# Patient Record
Sex: Male | Born: 1955 | Race: White | Hispanic: No | Marital: Married | Smoking: Never smoker
Health system: Southern US, Community
[De-identification: ages and names within clinical notes are randomized; demographics above are authoritative.]

## PROBLEM LIST (undated history)

## (undated) DIAGNOSIS — K5790 Diverticulosis of intestine, part unspecified, without perforation or abscess without bleeding: Secondary | ICD-10-CM

## (undated) DIAGNOSIS — M109 Gout, unspecified: Secondary | ICD-10-CM

## (undated) DIAGNOSIS — C801 Malignant (primary) neoplasm, unspecified: Secondary | ICD-10-CM

## (undated) DIAGNOSIS — T7840XA Allergy, unspecified, initial encounter: Secondary | ICD-10-CM

## (undated) DIAGNOSIS — M1990 Unspecified osteoarthritis, unspecified site: Secondary | ICD-10-CM

## (undated) DIAGNOSIS — K259 Gastric ulcer, unspecified as acute or chronic, without hemorrhage or perforation: Secondary | ICD-10-CM

## (undated) DIAGNOSIS — G709 Myoneural disorder, unspecified: Secondary | ICD-10-CM

## (undated) DIAGNOSIS — Z860100 Personal history of colon polyps, unspecified: Secondary | ICD-10-CM

## (undated) DIAGNOSIS — N200 Calculus of kidney: Secondary | ICD-10-CM

## (undated) HISTORY — DX: Diverticulosis of intestine, part unspecified, without perforation or abscess without bleeding: K57.90

## (undated) HISTORY — PX: MENISCUS REPAIR: SHX5179

## (undated) HISTORY — DX: Unspecified osteoarthritis, unspecified site: M19.90

## (undated) HISTORY — PX: NASAL SEPTUM SURGERY: SHX37

## (undated) HISTORY — DX: Myoneural disorder, unspecified: G70.9

## (undated) HISTORY — DX: Allergy, unspecified, initial encounter: T78.40XA

## (undated) HISTORY — PX: OTHER SURGICAL HISTORY: SHX169

## (undated) HISTORY — DX: Malignant (primary) neoplasm, unspecified: C80.1

## (undated) HISTORY — DX: Personal history of colonic polyps: Z86.010

## (undated) HISTORY — PX: EYE SURGERY: SHX253

## (undated) HISTORY — PX: UVULOPLASTY: SHX6557

## (undated) HISTORY — DX: Gilbert syndrome: E80.4

## (undated) HISTORY — DX: Personal history of colon polyps, unspecified: Z86.0100

## (undated) HISTORY — DX: Gastric ulcer, unspecified as acute or chronic, without hemorrhage or perforation: K25.9

## (undated) HISTORY — PX: VASECTOMY: SHX75

---

## 2001-10-15 ENCOUNTER — Ambulatory Visit (HOSPITAL_BASED_OUTPATIENT_CLINIC_OR_DEPARTMENT_OTHER): Admission: RE | Admit: 2001-10-15 | Discharge: 2001-10-15 | Payer: Self-pay | Admitting: Otolaryngology

## 2001-12-18 ENCOUNTER — Encounter: Payer: Self-pay | Admitting: Otolaryngology

## 2001-12-21 ENCOUNTER — Encounter (INDEPENDENT_AMBULATORY_CARE_PROVIDER_SITE_OTHER): Payer: Self-pay | Admitting: *Deleted

## 2001-12-21 ENCOUNTER — Inpatient Hospital Stay (HOSPITAL_COMMUNITY): Admission: RE | Admit: 2001-12-21 | Discharge: 2001-12-23 | Payer: Self-pay | Admitting: Otolaryngology

## 2003-05-20 ENCOUNTER — Encounter: Payer: Self-pay | Admitting: Internal Medicine

## 2003-05-20 ENCOUNTER — Ambulatory Visit (HOSPITAL_COMMUNITY): Admission: RE | Admit: 2003-05-20 | Discharge: 2003-05-20 | Payer: Self-pay | Admitting: Internal Medicine

## 2003-06-02 HISTORY — PX: ESOPHAGOGASTRODUODENOSCOPY: SHX1529

## 2003-08-19 ENCOUNTER — Ambulatory Visit: Admission: RE | Admit: 2003-08-19 | Discharge: 2003-09-15 | Payer: Self-pay | Admitting: Radiation Oncology

## 2003-11-07 ENCOUNTER — Ambulatory Visit (HOSPITAL_COMMUNITY): Admission: RE | Admit: 2003-11-07 | Discharge: 2003-11-07 | Payer: Self-pay | Admitting: Urology

## 2003-11-12 ENCOUNTER — Encounter (INDEPENDENT_AMBULATORY_CARE_PROVIDER_SITE_OTHER): Payer: Self-pay | Admitting: Specialist

## 2003-11-12 ENCOUNTER — Inpatient Hospital Stay (HOSPITAL_COMMUNITY): Admission: RE | Admit: 2003-11-12 | Discharge: 2003-11-14 | Payer: Self-pay | Admitting: Urology

## 2005-02-11 ENCOUNTER — Encounter: Admission: RE | Admit: 2005-02-11 | Discharge: 2005-02-11 | Payer: Self-pay | Admitting: Orthopedic Surgery

## 2005-05-04 ENCOUNTER — Ambulatory Visit (HOSPITAL_COMMUNITY): Admission: RE | Admit: 2005-05-04 | Discharge: 2005-05-04 | Payer: Self-pay | Admitting: Internal Medicine

## 2005-08-29 ENCOUNTER — Encounter: Admission: RE | Admit: 2005-08-29 | Discharge: 2005-08-29 | Payer: Self-pay | Admitting: Internal Medicine

## 2005-10-24 ENCOUNTER — Encounter: Admission: RE | Admit: 2005-10-24 | Discharge: 2005-10-24 | Payer: Self-pay | Admitting: Orthopedic Surgery

## 2006-12-15 ENCOUNTER — Encounter: Admission: RE | Admit: 2006-12-15 | Discharge: 2006-12-15 | Payer: Self-pay | Admitting: Orthopedic Surgery

## 2008-11-26 ENCOUNTER — Emergency Department (HOSPITAL_BASED_OUTPATIENT_CLINIC_OR_DEPARTMENT_OTHER): Admission: EM | Admit: 2008-11-26 | Discharge: 2008-11-26 | Payer: Self-pay | Admitting: Emergency Medicine

## 2008-11-26 ENCOUNTER — Ambulatory Visit: Payer: Self-pay | Admitting: Diagnostic Radiology

## 2010-10-10 ENCOUNTER — Encounter: Payer: Self-pay | Admitting: Internal Medicine

## 2010-11-05 ENCOUNTER — Encounter (HOSPITAL_COMMUNITY): Payer: 59

## 2010-11-05 ENCOUNTER — Other Ambulatory Visit: Payer: Self-pay | Admitting: Surgery

## 2010-11-05 DIAGNOSIS — Z01812 Encounter for preprocedural laboratory examination: Secondary | ICD-10-CM | POA: Insufficient documentation

## 2010-11-05 LAB — CBC
MCH: 29.7 pg (ref 26.0–34.0)
MCHC: 33.7 g/dL (ref 30.0–36.0)
Platelets: 213 10*3/uL (ref 150–400)
RBC: 5.55 MIL/uL (ref 4.22–5.81)

## 2010-11-05 LAB — SURGICAL PCR SCREEN: Staphylococcus aureus: NEGATIVE

## 2010-11-12 ENCOUNTER — Ambulatory Visit (HOSPITAL_COMMUNITY)
Admission: RE | Admit: 2010-11-12 | Discharge: 2010-11-12 | Disposition: A | Discharge status: Home / Self Care | Payer: 59 | Source: Ambulatory Visit | Attending: Surgery | Admitting: Surgery

## 2010-11-12 DIAGNOSIS — K420 Umbilical hernia with obstruction, without gangrene: Secondary | ICD-10-CM | POA: Insufficient documentation

## 2010-11-12 DIAGNOSIS — E669 Obesity, unspecified: Secondary | ICD-10-CM | POA: Insufficient documentation

## 2010-11-29 NOTE — Op Note (Signed)
NAME:  Terry Tate, Terry Tate NO.:  [id number]  MEDICAL RECORD NO.:  [id number]           PATIENT TYPE:  O  LOCATION:  DAYL                         FACILITY:  [facility]  PHYSICIAN:  Ardeth Sportsman, MD     DATE OF BIRTH:  May 17, 1956  DATE OF PROCEDURE:  11/12/2010 DATE OF DISCHARGE:                              OPERATIVE REPORT   PRIMARY CARE PHYSICIAN:  Thora Lance, M.D.  REQUESTING PHYSICIAN:  Bertram Millard. Dahlstedt, M.D., with [facility].  OPERATING SURGEON:  Ardeth Sportsman, M.D.  ASSISTANT:  RN.  PREOPERATIVE DIAGNOSIS:  Umbilical mass, probable incarcerated umbilical hernia.  POSTOPERATIVE DIAGNOSIS:   1.  Incarcerated umbilical hernia. 2.  Possible small right inguinal hernia, nonincarcerated.  PROCEDURE PERFORMED:  Laparoscopic reduction and repair of incarcerated umbilical hernia with mesh.  ANESTHESIA: 1. General anesthesia. 2. Local anesthetic in a field block around all fascial stitch sites.  SPECIMENS:  Incarcerated epiploic appendage (not sent).  DRAINS:  None.  ESTIMATED BLOOD LOSS:  Minimal.  COMPLICATIONS:  None.  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  It was sensitive to touch.  It was not reducible.  Anatomy and physiology of abdominal formation was discussed. Pathophysiology of subcutaneous masses and discussion of lipomas versus incarcerated umbilical hernia was discussed.  Options discussed. Recommendation was made for diagnostic laparoscopy with possible umbilical ventral hernia repair versus excision of lipoma.  Risks, benefits, and alternatives discussed.  Questions answered and he agreed to proceed.  DESCRIPTION OF PROCEDURE:  Informed consent was confirmed.  The patient received IV cefazolin prior to incision.  He voided prior to coming to the operating room.  He had sequential compression devices active during the  entire case.  He underwent general anesthesia without any difficulty.  His abdomen was clipped, prepped, and draped in a sterile fashion.  Surgical time-out confirmed the plan.  I placed a 5 mm port in the left upper quadrant using optical entry technique with the patient in steep reverse Trendelenburg and left side up.  Entry was clean.  I induced carbon dioxide insufflation.  Camera inspection revealed an extended epiploic appendage coming from the transverse colon and incarcerated in the umbilical region.  I was able to partially reduce it down after he was under complete general anesthesia.  I used cautery scissors and blunt dissection to reduce out this stretched down and dilated epiploic appendage and reduced out the fat.  It was rather long and stretched out with a thickened tip where it had been incarcerated.  I went ahead and tripped this excess epiploic appendage off to avoid a leak point of internal hernia.  I removed it out of the port.  Because it was obvious fat, I did not sent it.  I measured out the defect around 2 x 2 cm.  Given the fact he is a large male, very physically active, and it is 2 cm in size.  I felt he required mesh repair.   Therefore, I chose a 12 x 12 cm Parietex/Seprafilm dual-sided mesh.  I  rolled it  in the abdomen and secured it to the anterior abdominal pole using #1 Prolene  interrupted stitches x5 along its periphery.  There was some bleeding on the  left rectus muscles.  After placing the left lateral stitch, I placed a #1  Prolene above and below that and secured it down and that provided good hemostasis.  Of note, I had used a laparoscopic tacker to trim the rim and the central areas to hold in place.  I did camera inspection.  Hemostasis was excellent.  Colon looked healthy and uninjured.  I evacuated carbon dioxide.  I removed the ports.  I closed down the 10 mm fascial defect in left flank with 0 Vicryl stitch.  Note, I had placed this  stitch laparoscopically using a laparoscopic suture passer transabdominally under laparoscopic observation before closing down.  I closed the skin using 4-0 Monocryl stitch.  The puncture sites for the transabdominal fascial stitches were used using Steri-Strips.  A sterile dressing was applied.  The patient was extubated and taken to recovery room in stable condition.  I discussed postoperative care in detail with the patient in the office when I first met him and again in the holding area.  I have written instructions as well.  I am about to discuss it with his wife.     Ardeth Sportsman, MD     SCG/MEDQ  D:  11/12/2010  T:  11/13/2010  Job:  [id number]  cc:   Thora Lance, M.D. Fax: [phone]  Bertram Millard. Dahlstedt, M.D. Fax: [phone]  Electronically Signed by Karie Soda MD on 11/29/2010 12:21:28 PM

## 2010-12-30 LAB — URINALYSIS, ROUTINE W REFLEX MICROSCOPIC
Bilirubin Urine: NEGATIVE
Hgb urine dipstick: NEGATIVE
Ketones, ur: NEGATIVE mg/dL
Protein, ur: NEGATIVE mg/dL
Urobilinogen, UA: 0.2 mg/dL (ref 0.0–1.0)

## 2010-12-30 LAB — BASIC METABOLIC PANEL
BUN: 15 mg/dL (ref 6–23)
CO2: 28 mEq/L (ref 19–32)
Calcium: 9.3 mg/dL (ref 8.4–10.5)
Creatinine, Ser: 1 mg/dL (ref 0.4–1.5)
Glucose, Bld: 78 mg/dL (ref 70–99)

## 2010-12-30 LAB — DIFFERENTIAL
Basophils Absolute: 0.1 10*3/uL (ref 0.0–0.1)
Basophils Relative: 1 % (ref 0–1)
Neutro Abs: 7.4 10*3/uL (ref 1.7–7.7)
Neutrophils Relative %: 64 % (ref 43–77)

## 2010-12-30 LAB — CBC
MCHC: 34.4 g/dL (ref 30.0–36.0)
RDW: 13.3 % (ref 11.5–15.5)

## 2011-02-04 NOTE — Discharge Summary (Signed)
NAME:  Terry Tate, Terry Tate NO.:  [id number]   MEDICAL RECORD NO.:  [id number]                   PATIENT TYPE:  INP   LOCATION:  1610                                 FACILITY:  [facility]   PHYSICIAN:  Jamison Neighbor, M.D.               DATE OF BIRTH:  [birth year]   DATE OF ADMISSION:  11/12/2003  DATE OF DISCHARGE:  11/14/2003                                 DISCHARGE SUMMARY   DISCHARGE DIAGNOSIS:  Carcinoma of the prostate.   SECONDARY DIAGNOSIS:  Osteoarthritis.   PRINCIPAL PROCEDURE:  Radical prostatectomy with pelvic lymph node  dissection.   HISTORY:  This 55 year old male underwent a biopsy because of a PSA of 4.1.  The patient was found to have Gleason score 6 adenocarcinoma bilaterally.  The patient was given the options of therapy, including watchful waiting,  seed implantation, radiation therapy, cryosurgery, chemotherapy, hormonal  therapy, watchful waiting, and radical prostatectomy.  We also talked about  laparoscopic radical prostatectomy as an option.  The patient elected to  undergone standard radical prostatectomy, and was admitted following that  procedure.   PAST MEDICAL HISTORY:  1. Anterior cervical diskectomy.  2. Uvulectomy because of chronic sleep apnea.  3. Septum repair.   MEDICATIONS:  The patient is on no prescription drugs at the time of  admission, but did take a number of over-the-counter supplements.   The patient's family history, social history, review of systems, are well  described in the initial history and physical.   HOSPITAL COURSE:  The patient was taken to the operating room where he  underwent radical retropubic prostatectomy.  In the early postoperative  period his blood pressure was down a little bit and we thought he might have  some postural hypertension.  We did watch the patient carefully and elected  not to give him any blood transfusions as he was relatively asymptomatic.  He was rapidly advanced to a  regular diet.  He tolerated pain medication  without any difficulty.  It was felt he was ready for discharge by  postoperative day #2.  The patient was sent home with his Foley catheter and  with instructions for management.  He was sent home with Tylox, Colace, and  Cipro which can be started in preparation for removal of his catheter.  We  note that the patient's postoperative hematocrit was 27, it was not felt  that was low enough to require transfusion, but once he is stabilized at  home he will be started on iron supplementation.  He will return to the  office in one week for staple removal, and in two weeks for Foley removal,  as well as for review of his pathology.  Jamison Neighbor, M.D.    RJE/MEDQ  D:  11/27/2003  T:  11/28/2003  Job:  [id number]

## 2011-02-04 NOTE — H&P (Signed)
NAME:  Terry Tate, Terry Tate NO.:  [id number]   MEDICAL RECORD NO.:  [id number]                   PATIENT TYPE:  INP   LOCATION:  X001                                 FACILITY:  [facility]   PHYSICIAN:  Jamison Neighbor, M.D.               DATE OF BIRTH:  1956-08-07   DATE OF ADMISSION:  11/12/2003  DATE OF DISCHARGE:                                HISTORY & PHYSICAL   ADMITTING DIAGNOSIS:  Carcinoma of the prostate.   HISTORY:  This 55 year old male underwent prostate biopsy because his PSA  was 4.1, and, at his age, his PSA should be only 2.5.  He has had a  Gleason's score 6 adenocarcinoma bilaterally.  The patient was given options  as to all the appropriate therapy, and he elected to undergo surgery as  opposed to seed implantation or accompanying radiation therapy.  The patient  understands the risks and benefits of the procedure, and is to be admitted  following his radical prostatectomy.   PAST MEDICAL HISTORY:  Unremarkable.  He is status post anterior cervical  diskectomy.  He has had septum repairs, and he also underwent a uvulectomy  because of some problems with chronic sleep apnea.  He has no other chronic  medical illnesses.   CURRENT MEDICATIONS:  1. Multivitamin.  2. Glucosamine.  3. Chondroitin.  4. Advil.  5. Melatonin.  (All taken over-the-counter.)   REVIEW OF SYSTEMS:  Pertinent for his painful left ankle, probably due to  arthritis.  He does have some problems with poor sleep, although his sleep  apnea is gone since his uvulectomy.  He denies any other cardiac, pulmonary,  musculoskeletal, neurologic, endocrine, vascular, gastrointestinal,  dermatologic, or psychologic source, other than as described above.   PHYSICAL EXAMINATION:  VITAL SIGNS:  Temperature 97.3, pulse 68,  respirations 20, blood pressure 132/108, weight 252.  HEENT:  Normocephalic and atraumatic.  Cranial nerves II-XII were grossly  intact.  NECK:  Supple.  No  adenopathy or thyromegaly.  LUNGS:  Clear.  HEART:  Regular rate and rhythm without murmurs, thrills, gallops, rubs, or  heaves.  ABDOMEN:  Soft, nontender, with no palpable masses, rebound, or guarding.  GU:  Unremarkable.  The penis is free of any lesions.  The testicles are  normal in size, shape, and consistency.  There is no hydrocele, __________,  varicocele, hernia, or adenopathy.  RECTAL:  Shows microscopic prostate cancer, non-palpable to exam.  On  examination it feels quite benign.  The sphincter tone is normal.  EXTREMITIES:  No clubbing, cyanosis, or edema.  NEUROLOGIC/VASCULAR SYSTEMS:  Intact.   IMPRESSION:  Biopsy-proven carcinoma of the prostate, Gleason's score 6  bilateral.   PLAN:  Admit following radical retropubic prostatectomy.  Jamison Neighbor, M.D.    RJE/MEDQ  D:  11/12/2003  T:  11/12/2003  Job:  [id number]

## 2011-02-04 NOTE — Op Note (Signed)
[facility]  Patient:    Terry, Tate Visit Number: [id number] MRN: [id number]          Service Type: SUR Location: [id number] Attending Physician:  Fernande Boyden Dictated by:   Gloris Manchester. Lazarus Salines, M.D. Proc. Date: 12/21/01 Admit Date:  12/21/2001   CC:         [facility]   Operative Report  PREOPERATIVE DIAGNOSES: 1. Obstructive sleep apnea. 2. Nasal septal deviation with obstruction. 3. Hypertrophic inferior turbinates with obstruction.  POSTOPERATIVE DIAGNOSES: 1. Obstructive sleep apnea. 2. Nasal septal deviation with obstruction. 3. Hypertrophic inferior turbinates with obstruction.  PROCEDURES PERFORMED: 1. Nasal septoplasty. 2. UPP. 3. SMR of inferior turbinates, bilateral. 4. Tonsillectomy, bilateral.  SURGEON:  Karol T. Lazarus Salines, M.D.  ANESTHESIA:  General orotracheal.  BLOOD LOSS:  Minimal.  COMPLICATIONS:  None.  FINDINGS: An overall corrugated nasal septum, deviated towards the right. Hypertrophic inferior turbinates bilateral, right more than left.  2+ imbedded tonsils.  Long, thick uvula. Bulky tongue.  PROCEDURE:  With the patient in a comfortable supine position, general orotracheal anesthesia was induced without difficulty.  At an appropriate level, the table was turned 90 degrees and the patient placed in a semisitting position.  A saline-moistened throat pack was placed.  Nasal vibrissa were trimmed on both sides.  Cocaine crystals, 200 mg total, were applied on cotton carriers to the anterior ethmoid and sphenopalatine ganglion regions on both sides.  Cocaine solution, 150 mg, was applied on 0.5 x 3 inch cottonoids to both sides of the mucosa.  Finally, 1% Xylocaine with 1:100,000 epinephrine, 12 cc total, was infiltrated into the anterior floor of the nose, into the membranous columella, into the nasal spine region, and finally into the submucoperichondrial plane of the septum on both  sides.  Several minutes were allowed for this to take effect.  The materials were removed from the nose and observed to be intact and correct in number.  The findings were as described above.  A small right floor incision was sharply executed, and floor tunnel was elevated and carried up to the vomer posteriorly and brought forward.  On the left side, a hemitransfixion incision was sharply executed and continued down into a floor incision.  A floor tunnel was elevated back to the vomer and brought forward. The submucoperichondrial plane of the left septum was elevated, carried back to the perpendicular plate, and brought down and communicated with a floor tunnel.  A small linear rent anteriorly was generated at the maxillary crest with spurring.  Otherwise, the flap was intact.  The chondroethmoid junction was identified and lysed with a caudal elevator, and the opposite submucoperiosteal plane of the septum was elevated.  The superior perpendicular plate was lysed with open Jansen-Middleton forceps to avoid rocking in the cribriform plate region. Working posteriorly and inferiorly, the septum was submucosally dissected and then rocked free with the closed Jansen-Middleton forceps.  Additional spicules back toward the rostrum of the sphenoid and down towards the vomer were removed where they were mobile or displaced.  The posterior-inferior corner of the quadrangular cartilage was submucosally resected where it was spurred towards the right.  The septum was disarticulated from the maxillary crest, and 1-2 mm of inferior edge where it was heavily spurred were submucosally resected.  There was a spur in the sagittal plane vertically towards the left, and a 1 mm wide strip of cartilage was resected from this area to allow it to hinge.  The incision was carried up through the dorsal aspect of the septum to allow full hinge effect.  The cut edges of the cartilage were controlled with  interrupted 4-0 chromic suture to prevent imbrication.  At this point, the septum was straight and in the midline.  It was secured to the nasal spine with a figure-of-eight 4-0 PDS suture.  Septal tunnels were suctioned free, and hemostasis was observed.  A good, straight configuration of the septum was noted.  The incisions were closed with interrupted 4-0 chromic suture.  Just prior to completing the septoplasty, the inferior turbinates were infiltrated with 1% Xylocaine with 1:100,000 epinephrine, 5 cc total. Following completion of the septoplasty, beginning on the right side, the anterior hood of the inferior turbinate was lysed just behind the nasal valve region.  The medial mucosa of the turbinate was incised and brought forward in an anterior upsloping fashion, and a laterally based flap was elevated.  The turbinate was in-fractured.  Using angled turbinate scissors, turbinate bone and lateral mucosa were resected in a posterior, downsloping fashion, taking virtually all of the anterior pole and leaving virtually all of the posterior pole.  Cut spicules of bone were carefully submucosally resected to prevent delayed healing.  The bulbous posterior pole and mucosal edges were suctioned and coagulated for hemostasis.  The flap was lain back down.  The turbinate was outfractured, and this side was completed.  The left side was done in identical fashion, except that the turbinate was overall smaller.  After completing the septum and both turbinates, 0.04 reinforced Silastic was fashioned and placed against the septum and secured there, too, with a 3-0 nylon stitch.  A double-thickness Neosporin-impregnated Telfa pack was applied along the inferior turbinate on both sides.  Finally, 6.5 mm nasal trumpets, shortened to reach into the nasopharynx were placed on each side.  Again, hemostasis was observed.  The nasal procedure was completed.  The patient at this point was placed in  Trendelenburg.  The pharynx was  suctioned free, and the throat pack was removed.  A #4 flat blade Crowe-Davis mouth gag was used, placed without difficulty, taken care to protect lips, teeth, and endotracheal tube.  This allowed adequate visualization of the pharynx, although the tongue tended to ride towards one side or the other. 0.5% Xylocaine with 1:200,000 epinephrine, 8 cc total, was infiltrated into the peritonsillar planes and along the proposed incision site of the soft palate.  The previously placed tattoo mark on the muscular dimple of the soft palate was identified.  Using coagulating cautery, an arch was planned across the soft palate, right at the level of the [place] ink tattoo and carried down onto the anterior pillar of the tonsils.  This incision was carried into the submucosa and muscularis of the soft palate, cutting across the uvular musculature. The same incision was used to dissect it down onto the capsule of the tonsils, which were resected in their entirety, along with the anterior tonsillar pillar.  The pillar and tonsils were brought upward on both sides and communicated with the palatal flap.  The palate was finally amputated, leaving a longer posterior flap to reapproximate forward.  The palate with both tonsils en bloc was passed off as specimen.  Hemostasis was observed.  Beginning in the midline, the posterior mucosa was brought forward and reapproximated at the anterior palatal mucosa using interrupted 4-0 Vicryl sutures.  This was carried down to each side.  There was a slight horizontal banding in the posterior  pharyngeal wall, indicating adequate tension.  No further resection was felt necessary.  The palatal incision was closed in its entirety with a small area remaining open at the base of the tonsil on each side.  Hemostasis was observed.  The palate retractor was relaxed for several minutes.  Upon re-expansion, hemostasis was persistent.  At this  point, the procedure was completed.  An 81 French orogastric tube was placed into the stomach, and a small amount of clear, bilious secretions was evacuated.  The tube was removed.  The patient was returned to anesthesia, awakened, extubated, and transferred to recovery in stable condition.  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  Documented sleep apnea with a respiratory disturbance index of 23 was the indication for several components of todays procedure.  Anticipate a routine postoperative recovery with attention to analgesia, antibiosis, hydration, and observation for bleeding, emesis, or airway compromise.  Will plan on removing the nasal packing and tubes in 2-3 days and the septal splints at a 10-day mark.  Will have him maintain a limited activity for 2-1/2--3 weeks. Dictated by:   Gloris Manchester. Lazarus Salines, M.D. Attending Physician:  Fernande Boyden DD:  12/21/01 TD:  12/23/01 Job: [id number]

## 2011-02-04 NOTE — Op Note (Signed)
NAME:  Terry Tate, Terry Tate NO.:  [id number]   MEDICAL RECORD NO.:  [id number]                   PATIENT TYPE:  INP   LOCATION:  X001                                 FACILITY:  [facility]   PHYSICIAN:  Jamison Neighbor, M.D.               DATE OF BIRTH:  06-Aug-1956   DATE OF PROCEDURE:  11/12/2003  DATE OF DISCHARGE:                                 OPERATIVE REPORT   PREOPERATIVE DIAGNOSIS:  Carcinoma of the prostate.   POSTOPERATIVE DIAGNOSIS:  Carcinoma of the prostate.   OPERATION/PROCEDURE:  Radical retropubic prostatectomy.   SURGEON:  Jamison Neighbor, M.D.   ASSISTANT:  Valetta Fuller, M.D.   ANESTHESIA:  General.   COMPLICATIONS:  None.   DRAINS:  20-French Silastic Foley catheter and #10 flat Jackson-Pratt drain.   HISTORY:  This is a 55 year old male who had a biopsy of the prostate done  because his PSA was 4.1.  At his age normal should be 2.5.  The patient was  found to have 10% of his prostatic biopsy on each side positive for  carcinoma with a Gleason's score of 6.  The patient had a chance to review  all the options for therapy including radical retropubic prostatectomy,  radical perineal prostatectomy, robotic laparoscopic prostatectomy,  radiation therapy, seed implantation, cryosurgery, chemotherapy, and  hormonal therapy.  The patient has elected to undergo radical prostatectomy.  He understands the risks and benefits of the procedure.  He is aware of the  potential for problems with erections as well as for urinary control as well  as the options available to him should he require additional therapy.  He  gave full informed consent.   DESCRIPTION OF PROCEDURE:  After successful induction of general anesthesia,  the patient was placed in the supine position, prepped with Betadine and  draped in the usual sterile fashion.   A lower midline incision was made and was carried down through Scarpa's  fascia and down to the rectus sheath.   The rectus sheath was opened in the  midline.  The rectus abdominis was split and the space of Retzius was  developed.  Buchwalter retractor was inserted.  A node dissection was  performed on each side.  The node packets were taken from the obturator  fossa exposing the obturator nerve but not injuring them in any way.  On  both sides, hemostasis was obtained with surgical clips.  The endopelvic  fascia was opened on each side and the puboprostatics were identified.  The  puboprostatics were cleaned off and then clipped.  A Hohenfellner clamp was  placed around the dorsal vein.  The dorsal vein was ligated with two  separate ties of 0 Vicryl and a suture ligature of 2-0 Vicryl was also  utilized.  The dorsal vein was not difficult to control but lateral bleeding  on the patient's right-hand side and a large vascular pedicle  was somewhat  difficult to control but eventually this was oversewn and controlled with  Vicryl sutures.  Care was taken to insure that this did not in any way  impact all the neurovascular bundles.  The urethra was identified and after  the dorsal vein had been transected, the urethra could easily be palpated.  The neurovascular bundle was not injured.  The urethra was encircled and the  top portion of the urethra was cut.  The Foley catheter was brought up  through the incision.  It was then cut and then the back wall of the Foley  catheter was cut with an excellent stump of urethra left in place.   The prostate was then elevated and an appropriate plane was developed.  The  lateral pedicles were taken down with good nerve-sparing observed.  Seminal  vesicles were identified after Denonvilliers fascia had been opened.  The  vas were clipped on each side.  Seminal vessels were dissected down and  clipped at the base to control the vessels.  The remainder of the pedicle  was taken down with surgical clips.  The bladder neck was then transected  with much of the  circular fibers preserved.  A small specimen was sent from  the bladder neck to insure that there was no residual prostatic tissue left.  The mucosa was everted with 4-0 Vicryl sutures.  The anastomosis was then  performed in the usual fashion.  Double-armed sutures of Monocryl were then  placed at the 2 o'clock, 5 o'clock, 7 o'clock, 10 o'clock, and 12 o'clock  positions and were appropriately tagged.  The Foley catheter was then  brought up into the bladder and all the sutures were then placed at the  appropriate location on the bladder neck.  The catheter was inflated.  The  anastomosis was then completed by tying down the sutures with an appropriate  degree of tension.  When the catheter was irrigated, there was no leakage.  Final specimens showed that adequate hemostasis had been obtained.  All  sponges and lap packs had been removed.  The sponge, instrument and needle  counts were correct prior to closure.  The patient had a small drain placed  and sutured in place with a 2-0 silk suture.   The incision was then closed with a running suture of #1 PDS.  The skin was  closed with surgical staples after irrigation.  The patient tolerated the  procedure well and was taken to the recovery room in good condition.                                               Jamison Neighbor, M.D.    RJE/MEDQ  D:  11/12/2003  T:  11/12/2003  Job:  [id number]   cc:   Thora Lance, M.D.  [address]  [place]  [place] [address]  Fax: [phone]

## 2011-06-07 ENCOUNTER — Other Ambulatory Visit: Payer: Self-pay | Admitting: Dermatology

## 2012-12-17 ENCOUNTER — Other Ambulatory Visit: Payer: Self-pay | Admitting: Dermatology

## 2017-02-16 DIAGNOSIS — D1801 Hemangioma of skin and subcutaneous tissue: Secondary | ICD-10-CM | POA: Diagnosis not present

## 2017-02-16 DIAGNOSIS — D225 Melanocytic nevi of trunk: Secondary | ICD-10-CM | POA: Diagnosis not present

## 2017-02-16 DIAGNOSIS — L570 Actinic keratosis: Secondary | ICD-10-CM | POA: Diagnosis not present

## 2017-02-16 DIAGNOSIS — L821 Other seborrheic keratosis: Secondary | ICD-10-CM | POA: Diagnosis not present

## 2017-03-12 DIAGNOSIS — T1502XA Foreign body in cornea, left eye, initial encounter: Secondary | ICD-10-CM | POA: Diagnosis not present

## 2017-05-09 DIAGNOSIS — Z86010 Personal history of colonic polyps: Secondary | ICD-10-CM | POA: Diagnosis not present

## 2017-05-09 DIAGNOSIS — Z1211 Encounter for screening for malignant neoplasm of colon: Secondary | ICD-10-CM | POA: Diagnosis not present

## 2017-06-16 DIAGNOSIS — Z09 Encounter for follow-up examination after completed treatment for conditions other than malignant neoplasm: Secondary | ICD-10-CM | POA: Diagnosis not present

## 2017-06-16 DIAGNOSIS — K5790 Diverticulosis of intestine, part unspecified, without perforation or abscess without bleeding: Secondary | ICD-10-CM | POA: Diagnosis not present

## 2017-06-16 DIAGNOSIS — Z1211 Encounter for screening for malignant neoplasm of colon: Secondary | ICD-10-CM | POA: Diagnosis not present

## 2017-06-16 DIAGNOSIS — Z8719 Personal history of other diseases of the digestive system: Secondary | ICD-10-CM | POA: Diagnosis not present

## 2017-06-16 DIAGNOSIS — K5730 Diverticulosis of large intestine without perforation or abscess without bleeding: Secondary | ICD-10-CM | POA: Diagnosis not present

## 2017-06-16 DIAGNOSIS — Z86010 Personal history of colonic polyps: Secondary | ICD-10-CM | POA: Diagnosis not present

## 2017-06-16 HISTORY — PX: COLONOSCOPY: SHX174

## 2017-06-25 DIAGNOSIS — L259 Unspecified contact dermatitis, unspecified cause: Secondary | ICD-10-CM | POA: Diagnosis not present

## 2017-06-25 DIAGNOSIS — Z23 Encounter for immunization: Secondary | ICD-10-CM | POA: Diagnosis not present

## 2017-07-21 DIAGNOSIS — N200 Calculus of kidney: Secondary | ICD-10-CM | POA: Diagnosis not present

## 2017-07-21 DIAGNOSIS — E291 Testicular hypofunction: Secondary | ICD-10-CM | POA: Diagnosis not present

## 2017-07-25 DIAGNOSIS — Z0000 Encounter for general adult medical examination without abnormal findings: Secondary | ICD-10-CM | POA: Diagnosis not present

## 2017-07-25 DIAGNOSIS — E7800 Pure hypercholesterolemia, unspecified: Secondary | ICD-10-CM | POA: Diagnosis not present

## 2017-07-25 DIAGNOSIS — M65341 Trigger finger, right ring finger: Secondary | ICD-10-CM | POA: Diagnosis not present

## 2017-07-25 DIAGNOSIS — G4700 Insomnia, unspecified: Secondary | ICD-10-CM | POA: Diagnosis not present

## 2017-12-06 DIAGNOSIS — M7662 Achilles tendinitis, left leg: Secondary | ICD-10-CM | POA: Diagnosis not present

## 2017-12-06 DIAGNOSIS — G5761 Lesion of plantar nerve, right lower limb: Secondary | ICD-10-CM | POA: Diagnosis not present

## 2017-12-06 DIAGNOSIS — M67372 Transient synovitis, left ankle and foot: Secondary | ICD-10-CM | POA: Diagnosis not present

## 2017-12-06 DIAGNOSIS — M7661 Achilles tendinitis, right leg: Secondary | ICD-10-CM | POA: Diagnosis not present

## 2017-12-06 DIAGNOSIS — M9262 Juvenile osteochondrosis of tarsus, left ankle: Secondary | ICD-10-CM | POA: Diagnosis not present

## 2017-12-06 DIAGNOSIS — M9261 Juvenile osteochondrosis of tarsus, right ankle: Secondary | ICD-10-CM | POA: Diagnosis not present

## 2017-12-06 DIAGNOSIS — M19072 Primary osteoarthritis, left ankle and foot: Secondary | ICD-10-CM | POA: Diagnosis not present

## 2017-12-20 DIAGNOSIS — G5762 Lesion of plantar nerve, left lower limb: Secondary | ICD-10-CM | POA: Diagnosis not present

## 2017-12-20 DIAGNOSIS — M7661 Achilles tendinitis, right leg: Secondary | ICD-10-CM | POA: Diagnosis not present

## 2017-12-20 DIAGNOSIS — G5761 Lesion of plantar nerve, right lower limb: Secondary | ICD-10-CM | POA: Diagnosis not present

## 2017-12-20 DIAGNOSIS — M7751 Other enthesopathy of right foot: Secondary | ICD-10-CM | POA: Diagnosis not present

## 2017-12-27 DIAGNOSIS — L820 Inflamed seborrheic keratosis: Secondary | ICD-10-CM | POA: Diagnosis not present

## 2017-12-27 DIAGNOSIS — D2361 Other benign neoplasm of skin of right upper limb, including shoulder: Secondary | ICD-10-CM | POA: Diagnosis not present

## 2017-12-27 DIAGNOSIS — L821 Other seborrheic keratosis: Secondary | ICD-10-CM | POA: Diagnosis not present

## 2017-12-27 DIAGNOSIS — D2239 Melanocytic nevi of other parts of face: Secondary | ICD-10-CM | POA: Diagnosis not present

## 2018-01-01 DIAGNOSIS — G5763 Lesion of plantar nerve, bilateral lower limbs: Secondary | ICD-10-CM | POA: Diagnosis not present

## 2018-01-01 DIAGNOSIS — M7751 Other enthesopathy of right foot: Secondary | ICD-10-CM | POA: Diagnosis not present

## 2018-05-31 DIAGNOSIS — G5763 Lesion of plantar nerve, bilateral lower limbs: Secondary | ICD-10-CM | POA: Diagnosis not present

## 2018-06-15 DIAGNOSIS — M25562 Pain in left knee: Secondary | ICD-10-CM | POA: Insufficient documentation

## 2018-06-22 DIAGNOSIS — M84374A Stress fracture, right foot, initial encounter for fracture: Secondary | ICD-10-CM | POA: Diagnosis not present

## 2018-06-26 DIAGNOSIS — M25562 Pain in left knee: Secondary | ICD-10-CM | POA: Diagnosis not present

## 2018-06-28 DIAGNOSIS — M84374D Stress fracture, right foot, subsequent encounter for fracture with routine healing: Secondary | ICD-10-CM | POA: Diagnosis not present

## 2018-07-12 DIAGNOSIS — M84374D Stress fracture, right foot, subsequent encounter for fracture with routine healing: Secondary | ICD-10-CM | POA: Diagnosis not present

## 2018-07-25 DIAGNOSIS — S83242A Other tear of medial meniscus, current injury, left knee, initial encounter: Secondary | ICD-10-CM | POA: Diagnosis not present

## 2018-07-25 DIAGNOSIS — M25562 Pain in left knee: Secondary | ICD-10-CM | POA: Diagnosis not present

## 2018-07-26 DIAGNOSIS — E291 Testicular hypofunction: Secondary | ICD-10-CM | POA: Diagnosis not present

## 2018-07-26 DIAGNOSIS — M84374D Stress fracture, right foot, subsequent encounter for fracture with routine healing: Secondary | ICD-10-CM | POA: Diagnosis not present

## 2018-07-26 DIAGNOSIS — Z8546 Personal history of malignant neoplasm of prostate: Secondary | ICD-10-CM | POA: Diagnosis not present

## 2018-07-26 DIAGNOSIS — E7800 Pure hypercholesterolemia, unspecified: Secondary | ICD-10-CM | POA: Diagnosis not present

## 2018-07-26 DIAGNOSIS — Z5181 Encounter for therapeutic drug level monitoring: Secondary | ICD-10-CM | POA: Diagnosis not present

## 2018-07-26 DIAGNOSIS — Z0000 Encounter for general adult medical examination without abnormal findings: Secondary | ICD-10-CM | POA: Diagnosis not present

## 2018-07-27 DIAGNOSIS — C61 Malignant neoplasm of prostate: Secondary | ICD-10-CM | POA: Diagnosis not present

## 2018-08-03 DIAGNOSIS — E291 Testicular hypofunction: Secondary | ICD-10-CM | POA: Diagnosis not present

## 2018-08-03 DIAGNOSIS — N200 Calculus of kidney: Secondary | ICD-10-CM | POA: Diagnosis not present

## 2018-08-03 DIAGNOSIS — C61 Malignant neoplasm of prostate: Secondary | ICD-10-CM | POA: Diagnosis not present

## 2018-08-07 DIAGNOSIS — E291 Testicular hypofunction: Secondary | ICD-10-CM | POA: Diagnosis not present

## 2018-08-14 DIAGNOSIS — G8918 Other acute postprocedural pain: Secondary | ICD-10-CM | POA: Diagnosis not present

## 2018-08-14 DIAGNOSIS — M23322 Other meniscus derangements, posterior horn of medial meniscus, left knee: Secondary | ICD-10-CM | POA: Diagnosis not present

## 2018-08-14 DIAGNOSIS — M948X6 Other specified disorders of cartilage, lower leg: Secondary | ICD-10-CM | POA: Diagnosis not present

## 2018-09-04 DIAGNOSIS — M25362 Other instability, left knee: Secondary | ICD-10-CM | POA: Diagnosis not present

## 2018-09-27 DIAGNOSIS — M25562 Pain in left knee: Secondary | ICD-10-CM | POA: Diagnosis not present

## 2018-10-29 DIAGNOSIS — E291 Testicular hypofunction: Secondary | ICD-10-CM | POA: Diagnosis not present

## 2018-11-07 ENCOUNTER — Emergency Department (HOSPITAL_BASED_OUTPATIENT_CLINIC_OR_DEPARTMENT_OTHER): Payer: 59

## 2018-11-07 ENCOUNTER — Other Ambulatory Visit: Payer: Self-pay

## 2018-11-07 ENCOUNTER — Encounter (HOSPITAL_BASED_OUTPATIENT_CLINIC_OR_DEPARTMENT_OTHER): Payer: Self-pay | Admitting: *Deleted

## 2018-11-07 ENCOUNTER — Emergency Department (HOSPITAL_BASED_OUTPATIENT_CLINIC_OR_DEPARTMENT_OTHER)
Admission: EM | Admit: 2018-11-07 | Discharge: 2018-11-07 | Disposition: A | Discharge status: Home / Self Care | Payer: 59 | Attending: Emergency Medicine | Admitting: Emergency Medicine

## 2018-11-07 DIAGNOSIS — K5792 Diverticulitis of intestine, part unspecified, without perforation or abscess without bleeding: Secondary | ICD-10-CM

## 2018-11-07 DIAGNOSIS — N200 Calculus of kidney: Secondary | ICD-10-CM | POA: Diagnosis not present

## 2018-11-07 DIAGNOSIS — R1084 Generalized abdominal pain: Secondary | ICD-10-CM | POA: Diagnosis present

## 2018-11-07 HISTORY — DX: Calculus of kidney: N20.0

## 2018-11-07 LAB — URINALYSIS, ROUTINE W REFLEX MICROSCOPIC
BILIRUBIN URINE: NEGATIVE
Glucose, UA: NEGATIVE mg/dL
HGB URINE DIPSTICK: NEGATIVE
KETONES UR: NEGATIVE mg/dL
Leukocytes,Ua: NEGATIVE
NITRITE: NEGATIVE
PH: 8 (ref 5.0–8.0)
Protein, ur: NEGATIVE mg/dL
SPECIFIC GRAVITY, URINE: 1.01 (ref 1.005–1.030)

## 2018-11-07 LAB — COMPREHENSIVE METABOLIC PANEL
ALBUMIN: 4.1 g/dL (ref 3.5–5.0)
ALK PHOS: 57 U/L (ref 38–126)
ALT: 25 U/L (ref 0–44)
ANION GAP: 6 (ref 5–15)
AST: 27 U/L (ref 15–41)
BILIRUBIN TOTAL: 1 mg/dL (ref 0.3–1.2)
BUN: 15 mg/dL (ref 8–23)
CALCIUM: 8.9 mg/dL (ref 8.9–10.3)
CO2: 26 mmol/L (ref 22–32)
Chloride: 104 mmol/L (ref 98–111)
Creatinine, Ser: 1.25 mg/dL — ABNORMAL HIGH (ref 0.61–1.24)
GFR calc Af Amer: >60 mL/min (ref >60)
GLUCOSE: 109 mg/dL — AB (ref 70–99)
POTASSIUM: 4.3 mmol/L (ref 3.5–5.1)
Sodium: 136 mmol/L (ref 135–145)
TOTAL PROTEIN: 7 g/dL (ref 6.5–8.1)

## 2018-11-07 LAB — CBC WITH DIFFERENTIAL/PLATELET
Abs Immature Granulocytes: 0.04 10*3/uL (ref 0.00–0.07)
Basophils Absolute: 0 10*3/uL (ref 0.0–0.1)
Basophils Relative: 0 %
EOS PCT: 2 %
Eosinophils Absolute: 0.2 10*3/uL (ref 0.0–0.5)
HEMATOCRIT: 51.1 % (ref 39.0–52.0)
Hemoglobin: 16.4 g/dL (ref 13.0–17.0)
Immature Granulocytes: 0 %
LYMPHS ABS: 3 10*3/uL (ref 0.7–4.0)
Lymphocytes Relative: 27 %
MCH: 28.5 pg (ref 26.0–34.0)
MCHC: 32.1 g/dL (ref 30.0–36.0)
MCV: 88.7 fL (ref 80.0–100.0)
MONO ABS: 1.2 10*3/uL — AB (ref 0.1–1.0)
MONOS PCT: 10 %
NRBC: 0 % (ref 0.0–0.2)
Neutro Abs: 6.8 10*3/uL (ref 1.7–7.7)
Neutrophils Relative %: 61 %
Platelets: 230 10*3/uL (ref 150–400)
RBC: 5.76 MIL/uL (ref 4.22–5.81)
RDW: 14.3 % (ref 11.5–15.5)
WBC: 11.2 10*3/uL — ABNORMAL HIGH (ref 4.0–10.5)

## 2018-11-07 LAB — LIPASE, BLOOD: LIPASE: 49 U/L (ref 11–51)

## 2018-11-07 MED ORDER — IOPAMIDOL (ISOVUE-300) INJECTION 61%
100.0000 mL | Freq: Once | INTRAVENOUS | Status: AC | PRN
Start: 2018-11-07 — End: 2018-11-07
  Administered 2018-11-07: 100 mL via INTRAVENOUS

## 2018-11-07 MED ORDER — METRONIDAZOLE 500 MG PO TABS
500.0000 mg | ORAL_TABLET | Freq: Once | ORAL | Status: AC
  Administered 2018-11-07: 500 mg via ORAL
  Filled 2018-11-07: qty 1

## 2018-11-07 MED ORDER — MORPHINE SULFATE (PF) 4 MG/ML IV SOLN
4.0000 mg | Freq: Once | INTRAVENOUS | Status: AC
  Administered 2018-11-07: 4 mg via INTRAVENOUS
  Filled 2018-11-07: qty 1

## 2018-11-07 MED ORDER — METRONIDAZOLE 500 MG PO TABS: 500.0000 mg | ORAL_TABLET | Freq: Three times a day (TID) | ORAL | 0 refills | Status: AC

## 2018-11-07 MED ORDER — CIPROFLOXACIN HCL 500 MG PO TABS
500.0000 mg | ORAL_TABLET | Freq: Once | ORAL | Status: AC
  Administered 2018-11-07: 500 mg via ORAL
  Filled 2018-11-07: qty 1

## 2018-11-07 MED ORDER — SODIUM CHLORIDE 0.9 % IV BOLUS
1000.0000 mL | Freq: Once | INTRAVENOUS | Status: AC
  Administered 2018-11-07: 1000 mL via INTRAVENOUS

## 2018-11-07 MED ORDER — CIPROFLOXACIN HCL 500 MG PO TABS: 500.0000 mg | ORAL_TABLET | Freq: Two times a day (BID) | ORAL | 0 refills | Status: DC

## 2018-11-07 NOTE — ED Triage Notes (Signed)
Pt c/o sudden onset of left flank pain x 2 days ago HX stones

## 2018-11-07 NOTE — ED Notes (Signed)
Pt drinking oral contrast.

## 2018-11-07 NOTE — ED Provider Notes (Signed)
[place] [facility] EMERGENCY DEPARTMENT Provider Note   CSN: [id number] Arrival date & time: 11/07/18  1443    History   Chief Complaint Chief Complaint  Patient presents with  . Flank Pain    HPI Terry Tate is a 63 y.o. male.     HPI Patient is a 63 year old male presents the emergency department 2 days of worsening left-sided abdominal pain and left flank pain.  No dysuria or urinary frequency.  He does have a history of kidney stones well as a history of diverticulitis.  He is not sure which one this may be.  He reports having some low back pain yesterday but this is now more localized to the left lower quadrant and left side today.  His pain is moderate in severity.  Denies nausea and vomiting.  He does not want any medication for pain at this time.  He reports constipation over the past 2 days.  No blood in his stool.  Low-grade fevers.   Past Medical History:  Diagnosis Date  . Kidney stones     There are no active problems to display for this patient.   Past Surgical History:  Procedure Laterality Date  . MENISCUS REPAIR          Home Medications    Prior to Admission medications   Medication Sig Start Date End Date Taking? Authorizing Provider  zolpidem (AMBIEN) 10 MG tablet Take 10 mg by mouth at bedtime as needed for sleep.   Yes [provider]  ciprofloxacin (CIPRO) 500 MG tablet Take 1 tablet (500 mg total) by mouth 2 (two) times daily. 11/07/18   Jola Schmidt, MD  metroNIDAZOLE (FLAGYL) 500 MG tablet Take 1 tablet (500 mg total) by mouth 3 (three) times daily for 7 days. 11/07/18 11/14/18  Jola Schmidt, MD    Family History History reviewed. No pertinent family history.  Social History Social History   Tobacco Use  . Smoking status: Never Smoker  . Smokeless tobacco: Never Used  Substance Use Topics  . Alcohol use: Not Currently  . Drug use: Not Currently     Allergies   Patient has no known allergies.   Review of  Systems Review of Systems  All other systems reviewed and are negative.    Physical Exam Updated Vital Signs BP 137/85 (BP Location: Left Arm)   Pulse 87   Temp 98.1 F (36.7 C) (Oral)   Resp 18   Ht 6' (1.829 m)   Wt 111.1 kg   SpO2 96%   BMI 33.23 kg/m   Physical Exam Vitals signs and nursing note reviewed.  Constitutional:      Appearance: He is well-developed.  HENT:     Head: Normocephalic and atraumatic.  Neck:     Musculoskeletal: Normal range of motion.  Cardiovascular:     Rate and Rhythm: Normal rate and regular rhythm.     Heart sounds: Normal heart sounds.  Pulmonary:     Effort: Pulmonary effort is normal. No respiratory distress.     Breath sounds: Normal breath sounds.  Abdominal:     General: There is no distension.     Palpations: Abdomen is soft.     Comments: Left-sided abdominal tenderness.  No guarding or rebound.  No peritoneal signs.  Musculoskeletal: Normal range of motion.  Skin:    General: Skin is warm and dry.  Neurological:     Mental Status: He is alert and oriented to person, place, and time.  Psychiatric:  Judgment: Judgment normal.      ED Treatments / Results  Labs (all labs ordered are listed, but only abnormal results are displayed) Labs Reviewed  CBC WITH DIFFERENTIAL/PLATELET - Abnormal; Notable for the following components:      Result Value   WBC 11.2 (*)    Monocytes Absolute 1.2 (*)    All other components within normal limits  COMPREHENSIVE METABOLIC PANEL - Abnormal; Notable for the following components:   Glucose, Bld 109 (*)    Creatinine, Ser 1.25 (*)    All other components within normal limits  URINALYSIS, ROUTINE W REFLEX MICROSCOPIC  LIPASE, BLOOD    EKG None  Radiology Ct Abdomen Pelvis W Contrast  Result Date: 11/07/2018 CLINICAL DATA:  Sudden onset LEFT flank pain 2 days ago, history of kidney stones EXAM: CT ABDOMEN AND PELVIS WITH CONTRAST TECHNIQUE: Multidetector CT imaging of the  abdomen and pelvis was performed using the standard protocol following bolus administration of intravenous contrast. Sagittal and coronal MPR images reconstructed from axial data set. CONTRAST:  169mL ISOVUE-300 IOPAMIDOL (ISOVUE-300) INJECTION 61% IV. Dilute oral contrast. COMPARISON:  06/01/2016 FINDINGS: Lower chest: Subsegmental atelectasis at BILATERAL lung bases. Hepatobiliary: Tiny probable hepatic cysts. Liver and gallbladder otherwise normal appearance. Pancreas: Normal appearance Spleen: Normal appearance Adrenals/Urinary Tract: Adrenal glands normal appearance. Small BILATERAL nonobstructing renal calculi, 6 mm LEFT and 3 mm RIGHT. Small LEFT renal cyst 15 x 14 mm. Additional probable small cysts at upper pole of both kidneys. No additional mass, hydronephrosis or hydroureter. Bladder unremarkable. Stomach/Bowel: Normal appendix. Diverticulosis of descending and sigmoid colon with focal wall thickening and pericolic inflammatory changes at the sigmoid colon consistent with acute diverticulitis. Additional stranding wall thickening are seen at the distal descending colon question concurrent site of diverticulitis. Minimal thickening of the lateral conal fascia. No extraluminal gas or focal fluid collection/abscess. Stomach and remaining bowel loops unremarkable. Vascular/Lymphatic: Atherosclerotic calcifications aorta and iliac arteries. Minimal coronary arterial calcification. Reproductive: Very small prostate gland versus previously resected. Other: No free air or free fluid. Small BILATERAL inguinal hernias containing fat. Musculoskeletal: Unremarkable IMPRESSION: BILATERAL nonobstructing renal calculi. Acute sigmoid diverticulitis without evidence of abscess. Additional focus of mild wall thickening and pericolic inflammatory changes at the distal descending colon likely a site of concurrent acute diverticulitis without abscess. Electronically Signed   By: Lavonia Dana M.D.   On: 11/07/2018 17:58     Procedures Procedures (including critical care time)  Medications Ordered in ED Medications  ciprofloxacin (CIPRO) tablet 500 mg (has no administration in time range)  metroNIDAZOLE (FLAGYL) tablet 500 mg (has no administration in time range)  sodium chloride 0.9 % bolus 1,000 mL (0 mLs Intravenous Stopped 11/07/18 1804)  iopamidol (ISOVUE-300) 61 % injection 100 mL (100 mLs Intravenous Contrast Given 11/07/18 1734)  morphine 4 MG/ML injection 4 mg (4 mg Intravenous Given 11/07/18 1732)     Initial Impression / Assessment and Plan / ED Course  I have reviewed the triage vital signs and the nursing notes.  Pertinent labs & imaging results that were available during my care of the patient were reviewed by me and considered in my medical decision making (see chart for details).       CT scan consistent with acute diverticulitis.  Patient be discharged home on ciprofloxacin and Flagyl.  He has pain medication at home.  He has had what sounds like 5-6 cases over the past 10 years.  He is interested in a surgical consultation as an outpatient for possible  partial colectomy..  Contact information is been given.  Overall well-appearing.  Final Clinical Impressions(s) / ED Diagnoses   Final diagnoses:  Acute diverticulitis    ED Discharge Orders         Ordered    ciprofloxacin (CIPRO) 500 MG tablet  2 times daily     11/07/18 1834    metroNIDAZOLE (FLAGYL) 500 MG tablet  3 times daily     11/07/18 Delight Hoh, MD 11/07/18 Bosie Helper

## 2018-11-15 DIAGNOSIS — M25562 Pain in left knee: Secondary | ICD-10-CM | POA: Diagnosis not present

## 2019-01-07 DIAGNOSIS — D485 Neoplasm of uncertain behavior of skin: Secondary | ICD-10-CM | POA: Diagnosis not present

## 2019-01-07 DIAGNOSIS — D1801 Hemangioma of skin and subcutaneous tissue: Secondary | ICD-10-CM | POA: Diagnosis not present

## 2019-01-07 DIAGNOSIS — D225 Melanocytic nevi of trunk: Secondary | ICD-10-CM | POA: Diagnosis not present

## 2019-01-07 DIAGNOSIS — L821 Other seborrheic keratosis: Secondary | ICD-10-CM | POA: Diagnosis not present

## 2019-01-07 DIAGNOSIS — D2372 Other benign neoplasm of skin of left lower limb, including hip: Secondary | ICD-10-CM | POA: Diagnosis not present

## 2019-02-21 ENCOUNTER — Encounter: Payer: Self-pay | Admitting: Podiatry

## 2019-02-21 ENCOUNTER — Ambulatory Visit: Payer: 59 | Admitting: Podiatry

## 2019-02-21 ENCOUNTER — Other Ambulatory Visit: Payer: Self-pay

## 2019-02-21 ENCOUNTER — Ambulatory Visit (INDEPENDENT_AMBULATORY_CARE_PROVIDER_SITE_OTHER): Payer: 59

## 2019-02-21 VITALS — Temp 98.1°F

## 2019-02-21 DIAGNOSIS — M1000 Idiopathic gout, unspecified site: Secondary | ICD-10-CM

## 2019-02-21 DIAGNOSIS — M2012 Hallux valgus (acquired), left foot: Secondary | ICD-10-CM

## 2019-02-21 DIAGNOSIS — M7751 Other enthesopathy of right foot: Secondary | ICD-10-CM | POA: Diagnosis not present

## 2019-02-21 DIAGNOSIS — M2011 Hallux valgus (acquired), right foot: Secondary | ICD-10-CM | POA: Diagnosis not present

## 2019-02-21 DIAGNOSIS — M779 Enthesopathy, unspecified: Secondary | ICD-10-CM

## 2019-02-21 MED ORDER — TRIAMCINOLONE ACETONIDE 10 MG/ML IJ SUSP
10.0000 mg | Freq: Once | INTRAMUSCULAR | Status: AC
  Administered 2019-02-21: 10 mg

## 2019-02-21 MED ORDER — PREDNISONE 10 MG PO TABS: ORAL_TABLET | ORAL | 0 refills | Status: DC

## 2019-02-21 NOTE — Progress Notes (Signed)
   Subjective:    Patient ID: Terry Tate, male    DOB: April 18, 1956, 63 y.o.   MRN: [id number]  HPI    Review of Systems  All other systems reviewed and are negative.      Objective:   Physical Exam        Assessment & Plan:

## 2019-02-21 NOTE — Patient Instructions (Signed)

## 2019-02-22 NOTE — Progress Notes (Signed)
Subjective:   Patient ID: Terry Tate, male   DOB: 63 y.o.   MRN: [id number]   HPI Patient presents stating that he had been tentatively diagnosed with neuromas and pain in the outside of the foot with possible fracture and has developed acute inflammation around his big toe joint right stating that it is very sore and making it hard for him to wear shoe gear.  Patient does not smoke likes to be active   Review of Systems  All other systems reviewed and are negative.       Objective:  Physical Exam Vitals signs and nursing note reviewed.  Constitutional:      Appearance: He is well-developed.  Pulmonary:     Effort: Pulmonary effort is normal.  Musculoskeletal: Normal range of motion.  Skin:    General: Skin is warm.  Neurological:     Mental Status: He is alert.     Neurovascular status found to be intact muscle strength was adequate range of motion within normal limits.  Patient is noted to have redness of the first MPJ right medial side it is very painful and is been going on for around 5 days and is noted to have possible neuromas for capsulitis of the lesser MPJs especially around the second bilateral.  Patient does have good digital perfusion well oriented x3     Assessment:  Strong possibility for gout-like symptomatology right with inflammatory capsulitis and possibility for second third MPJ capsulitis versus neuroma symptoms     Plan:  H&P reviewed all conditions.  On get a focus first on the gout-like symptomatology and I did do sterile prep and injected the first MPJ right medial side 3 mg Kenalog 5 mg Xylocaine and placed patient on 6-day Medrol Dosepak.  I then gave him a sheet discussing foods to be careful with that I am sending him for arthritic profile and I will see back in 2 weeks to reevaluate and we can look at the other conditions and see how he responded around the big toe joint  X-rays were negative for signs of ostial lysis or tophaceous deposits with  possible elongation second and third MPJs

## 2019-02-26 LAB — URIC ACID: Uric Acid, Serum: 6.6 mg/dL (ref 4.0–8.0)

## 2019-02-26 LAB — ANA: Anti Nuclear Antibody (ANA): NEGATIVE

## 2019-02-26 LAB — SEDIMENTATION RATE: Sed Rate: 9 mm/h (ref 0–20)

## 2019-02-26 LAB — RHEUMATOID FACTOR: Rheumatoid fact SerPl-aCnc: <14 IU/mL (ref <14)

## 2019-02-26 LAB — C-REACTIVE PROTEIN: CRP: 4.3 mg/L (ref <8.0)

## 2019-03-07 ENCOUNTER — Encounter: Payer: Self-pay | Admitting: Podiatry

## 2019-03-07 ENCOUNTER — Other Ambulatory Visit: Payer: Self-pay

## 2019-03-07 ENCOUNTER — Ambulatory Visit: Payer: 59 | Admitting: Podiatry

## 2019-03-07 VITALS — Temp 97.2°F

## 2019-03-07 DIAGNOSIS — D3610 Benign neoplasm of peripheral nerves and autonomic nervous system, unspecified: Secondary | ICD-10-CM

## 2019-03-07 DIAGNOSIS — M779 Enthesopathy, unspecified: Secondary | ICD-10-CM

## 2019-03-07 NOTE — Progress Notes (Signed)
Subjective:   Patient ID: Terry Tate, male   DOB: 63 y.o.   MRN: [id number]   HPI Patient presents stating his big toe joint feels so much better but he continues to get the discomfort in his forefoot right over left.  States is been going on for at least 5 years and that it seems to be in the center of his foot and is not sure if it is a nerve or something else   ROS      Objective:  Physical Exam  Neurovascular status intact with inflammation pain of the third MPJ right over left foot with no indications of neuroma symptomatology currently     Assessment:  Possibility for capsulitis versus neuroma and on the left he is more concerned about neuroma after having ankle surgery     Plan:  Reviewed neuroma of the left foot and discussed the possibility related to previous ankle surgery.  For the right foot I did a forefoot block I aspirated the third MPJ getting out a small amount of clear fluid injected quarter cc Dexasone Kenalog applied thick plantar padding and reappoint in 2 weeks and also discussed possible orthotic treatment for this condition

## 2019-03-21 ENCOUNTER — Encounter: Payer: Self-pay | Admitting: Podiatry

## 2019-03-21 ENCOUNTER — Ambulatory Visit (INDEPENDENT_AMBULATORY_CARE_PROVIDER_SITE_OTHER): Payer: 59 | Admitting: Podiatry

## 2019-03-21 ENCOUNTER — Other Ambulatory Visit: Payer: Self-pay

## 2019-03-21 VITALS — Temp 97.8°F

## 2019-03-21 DIAGNOSIS — M779 Enthesopathy, unspecified: Secondary | ICD-10-CM | POA: Diagnosis not present

## 2019-03-21 DIAGNOSIS — M2012 Hallux valgus (acquired), left foot: Secondary | ICD-10-CM

## 2019-03-21 DIAGNOSIS — D3610 Benign neoplasm of peripheral nerves and autonomic nervous system, unspecified: Secondary | ICD-10-CM

## 2019-03-21 DIAGNOSIS — M2011 Hallux valgus (acquired), right foot: Secondary | ICD-10-CM

## 2019-03-21 NOTE — Progress Notes (Signed)
Subjective:   Patient ID: Molli Knock, male   DOB: 63 y.o.   MRN: [id number]   HPI Patient states doing quite a bit better with the pain and it seems like what you did last time really help me   ROS      Objective:  Physical Exam  Neurovascular status intact with patient's right foot showing significant improvement around the metatarsal phalangeal joint with diminished discomfort noted     Assessment:  Possibility for capsulitis versus neuroma symptomatology bilateral     Plan:  H&P spent a great deal time going over both conditions and the distinction between the 2 and my hope that with his improvement that this will go away.  I have advised him on rigid bottom shoes and patient will be seen back for [place] to recheck if symptoms persist and may ultimately require surgical intervention which I made him aware of today

## 2019-07-05 ENCOUNTER — Other Ambulatory Visit: Payer: Self-pay

## 2019-07-05 ENCOUNTER — Telehealth: Payer: Self-pay | Admitting: *Deleted

## 2019-07-05 ENCOUNTER — Ambulatory Visit (INDEPENDENT_AMBULATORY_CARE_PROVIDER_SITE_OTHER): Payer: 59

## 2019-07-05 ENCOUNTER — Ambulatory Visit: Payer: 59 | Admitting: Podiatry

## 2019-07-05 DIAGNOSIS — M79671 Pain in right foot: Secondary | ICD-10-CM | POA: Diagnosis not present

## 2019-07-05 DIAGNOSIS — M109 Gout, unspecified: Secondary | ICD-10-CM | POA: Diagnosis not present

## 2019-07-05 MED ORDER — COLCHICINE 0.6 MG PO TABS: 0.6000 mg | ORAL_TABLET | Freq: Every day | ORAL | 0 refills | Status: DC

## 2019-07-05 MED ORDER — ALLOPURINOL 100 MG PO TABS: 100.0000 mg | ORAL_TABLET | Freq: Every day | ORAL | 0 refills | Status: AC

## 2019-07-05 NOTE — Telephone Encounter (Signed)
Pt states spoke to receptionist and was told earliest Dr. Paulla Dolly could see would be Thursday of next week.

## 2019-07-05 NOTE — Progress Notes (Signed)
Subjective:  Patient ID: Terry Tate, male    DOB: 1956/02/12,  MRN: [id number]  Chief Complaint  Patient presents with  . Foot Pain    pt states that he has gout on the medial side of the right foot, pain has been going on since yesterday, pt has some redness as well as some warmth on the right foot big toe    63 y.o. male presents with the above complaint.  Patient states that his pain started all of a sudden within the last week.  There is some redness warmth edema to the medial side of the MPJOn the right side.  States that it feels hot.  He has a history of gout.  He denies any other complaints.  He was previously seen by this practice for treatment of gout where they gave him an injection.  Which resolved the pain.  He has not tried anything else.   Review of Systems: Negative except as noted in the HPI. Denies N/V/F/Ch.  Past Medical History:  Diagnosis Date  . Kidney stones     Current Outpatient Medications:  Marland Kitchen  MAGNESIUM PO, Take 1 tablet by mouth daily., Disp: , Rfl:  .  Multiple Vitamin (MULTIVITAMIN PO), Take 1 tablet by mouth daily., Disp: , Rfl:  .  Omega-3 Fatty Acids (FISH OIL PO), Take 1 tablet by mouth daily., Disp: , Rfl:  .  tamsulosin (FLOMAX) 0.4 MG CAPS capsule, Take 0.4 mg by mouth., Disp: , Rfl:  .  traZODone (DESYREL) 50 MG tablet, 1 TABLET ONE HOUR BEFORE BEDTIME AS NEEDED ONCE A DAY ORALLY 30 DAY(S), Disp: , Rfl:  .  zolpidem (AMBIEN) 10 MG tablet, Take 10 mg by mouth at bedtime as needed for sleep., Disp: , Rfl:  .  allopurinol (ZYLOPRIM) 100 MG tablet, Take 1 tablet (100 mg total) by mouth daily., Disp: 30 tablet, Rfl: 0 .  colchicine 0.6 MG tablet, Take 1 tablet (0.6 mg total) by mouth daily., Disp: 30 tablet, Rfl: 0  Social History   Tobacco Use  Smoking Status Never Smoker  Smokeless Tobacco Never Used    No Known Allergies Objective:  There were no vitals filed for this visit. There is no height or weight on file to calculate BMI.  Constitutional Well developed. Well nourished.  Vascular Dorsalis pedis pulses palpable bilaterally. Posterior tibial pulses palpable bilaterally. Capillary refill normal to all digits.  No cyanosis or clubbing noted. Pedal hair growth normal.  Neurologic Normal speech. Oriented to person, place, and time. Epicritic sensation to light touch grossly present bilaterally.  Dermatologic Nails well groomed and normal in appearance. No open wounds. No skin lesions.  Orthopedic:  Right first MPJ pain on palpation.  No pain on palpation with range of motion on active and passive.   Radiographs: 2 views of skeletally mature adult.  There is a mild soft tissue edema and increasing soft tissue density surrounding the first MPJ of the right foot.  There is a joint space narrowing mildly.  Mild subchondral sclerosis present at the base of the proximal phalanx.  No other bony abnormalities noted. Assessment:   1. Gout of right foot, unspecified cause, unspecified chronicity    Plan:  Patient was evaluated and treated and all questions answered.  Right foot acute gout -I explained to the patient the etiology of gout and changing diet.  He states that he has a high red meat diet.  Explained the etiology and the treatment options available for treating gout. -Prescription  for colchicine and allopurinol was sent to pharmacy. -A steroid injection was performed at right first MPJ using 1% plain Lidocaine and 10 mg of Kenalog. This was well tolerated. -I also explained to the patient that he needs to follow-up with rheumatologist for long-term maintenance of gout.  Terry Tate will contact him soon.   No follow-ups on file.

## 2019-07-05 NOTE — Telephone Encounter (Signed)
Left message informing pt I had reviewed his clinicals and did not see an antiinflammatory prescribed by Dr. Paulla Dolly, and if he called in the next little bit we would be able to get him in to be seen today.

## 2019-07-09 ENCOUNTER — Encounter: Payer: Self-pay | Admitting: Podiatry

## 2019-07-09 ENCOUNTER — Telehealth: Payer: Self-pay | Admitting: *Deleted

## 2019-07-09 DIAGNOSIS — M79671 Pain in right foot: Secondary | ICD-10-CM

## 2019-07-09 DIAGNOSIS — M109 Gout, unspecified: Secondary | ICD-10-CM

## 2019-07-09 NOTE — Telephone Encounter (Signed)
Faxed required form, clinicals and demographics to [facility]. 

## 2019-07-09 NOTE — Telephone Encounter (Signed)
-----   Message from Felipa Furnace, DPM sent at 07/09/2019  7:19 AM EDT ----- Reina Fuse,  Can you make an appointment for this patient to follow-up with rheumatologist.  It does not matter which rheumatologist as long as he does.  Thanks Lennette Bihari

## 2019-07-11 ENCOUNTER — Encounter

## 2019-07-11 ENCOUNTER — Ambulatory Visit: Payer: 59 | Admitting: Podiatry

## 2019-07-11 ENCOUNTER — Telehealth: Payer: Self-pay | Admitting: *Deleted

## 2019-07-11 DIAGNOSIS — M1000 Idiopathic gout, unspecified site: Secondary | ICD-10-CM

## 2019-07-11 DIAGNOSIS — M109 Gout, unspecified: Secondary | ICD-10-CM

## 2019-07-11 NOTE — Telephone Encounter (Signed)
I called [facility] and spoke with Barnetta Chapel, to see if pt could call to schedule his appts. Barnetta Chapel stated pt could and I asked if she still had his referral and clinicals I faxed to [phone]. Barnetta Chapel states that was the proper fax number, but they did not need a referral to schedule.

## 2019-07-11 NOTE — Telephone Encounter (Signed)
I informed pt he could call [facility] and could schedule [phone]. Pt thanked me for the referral and information.

## 2019-07-11 NOTE — Telephone Encounter (Signed)
Pt states he was seen last week for gout and referral was to be sent to rheumatology, he received a call from [facility], but he would like to be referred to Dr. Adele Schilder - [facility].

## 2019-07-11 NOTE — Telephone Encounter (Signed)
Received notification from [place] [facility] stating contact their office to schedule.

## 2019-07-11 NOTE — Telephone Encounter (Signed)
[place] - main reception transferred to rheumatology, recording states fax [phone].

## 2019-08-05 ENCOUNTER — Other Ambulatory Visit: Payer: Self-pay | Admitting: Internal Medicine

## 2019-08-05 DIAGNOSIS — E7800 Pure hypercholesterolemia, unspecified: Secondary | ICD-10-CM

## 2019-08-14 ENCOUNTER — Ambulatory Visit
Admission: RE | Admit: 2019-08-14 | Discharge: 2019-08-14 | Disposition: A | Discharge status: Home / Self Care | Payer: 59 | Source: Ambulatory Visit | Attending: Internal Medicine | Admitting: Internal Medicine

## 2019-08-14 DIAGNOSIS — E7800 Pure hypercholesterolemia, unspecified: Secondary | ICD-10-CM

## 2019-11-14 ENCOUNTER — Other Ambulatory Visit (HOSPITAL_COMMUNITY): Payer: Self-pay | Admitting: Orthopedic Surgery

## 2019-11-21 ENCOUNTER — Encounter (HOSPITAL_BASED_OUTPATIENT_CLINIC_OR_DEPARTMENT_OTHER): Payer: Self-pay | Admitting: Orthopedic Surgery

## 2019-11-22 ENCOUNTER — Telehealth: Payer: Self-pay | Admitting: *Deleted

## 2019-11-22 NOTE — Telephone Encounter (Addendum)
Left message informing pt that he would need to make an appt for evaluation and to discuss the possibility of surgery with Dr. Paulla Dolly, and if this was indeed necessary he could sign consent forms and set up a surgery date at the appt time.

## 2019-11-22 NOTE — Telephone Encounter (Signed)
Pt states [facility] recommended foot surgery and he needed how to begin the process.

## 2019-11-25 ENCOUNTER — Other Ambulatory Visit (HOSPITAL_COMMUNITY): Payer: 59 | Attending: Orthopedic Surgery

## 2019-11-25 NOTE — Telephone Encounter (Signed)
Pt called states he is calling concerning scheduling with Dr. Paulla Dolly concerning the surgery.

## 2019-11-25 NOTE — Telephone Encounter (Signed)
I called Terry Tate and he asked if Dr. Paulla Dolly would review the MRI and I told him if he would bring the MRI reports, Dr. Paulla Dolly would review, and I transferred Terry Tate to scheduler.

## 2019-11-27 ENCOUNTER — Other Ambulatory Visit: Payer: Self-pay

## 2019-11-27 ENCOUNTER — Ambulatory Visit (INDEPENDENT_AMBULATORY_CARE_PROVIDER_SITE_OTHER): Payer: 59

## 2019-11-27 ENCOUNTER — Encounter: Payer: Self-pay | Admitting: Podiatry

## 2019-11-27 ENCOUNTER — Ambulatory Visit: Payer: 59 | Admitting: Podiatry

## 2019-11-27 ENCOUNTER — Other Ambulatory Visit: Payer: Self-pay | Admitting: Podiatry

## 2019-11-27 VITALS — Temp 97.3°F

## 2019-11-27 DIAGNOSIS — M779 Enthesopathy, unspecified: Secondary | ICD-10-CM

## 2019-11-27 DIAGNOSIS — D3610 Benign neoplasm of peripheral nerves and autonomic nervous system, unspecified: Secondary | ICD-10-CM

## 2019-11-27 DIAGNOSIS — M79671 Pain in right foot: Secondary | ICD-10-CM

## 2019-11-27 DIAGNOSIS — M1000 Idiopathic gout, unspecified site: Secondary | ICD-10-CM | POA: Diagnosis not present

## 2019-11-27 DIAGNOSIS — M79672 Pain in left foot: Secondary | ICD-10-CM | POA: Diagnosis not present

## 2019-11-27 NOTE — Patient Instructions (Signed)

## 2019-11-28 ENCOUNTER — Ambulatory Visit (HOSPITAL_BASED_OUTPATIENT_CLINIC_OR_DEPARTMENT_OTHER): Admission: RE | Admit: 2019-11-28 | Payer: 59 | Source: Home / Self Care | Admitting: Orthopedic Surgery

## 2019-11-28 HISTORY — DX: Gout, unspecified: M10.9

## 2019-11-28 SURGERY — EXCISION, MORTON'S NEUROMA
Anesthesia: General | Laterality: Bilateral

## 2019-11-28 NOTE — Progress Notes (Signed)
Subjective:   Patient ID: Terry Tate, male   DOB: 64 y.o.   MRN: [id number]   HPI Patient presents stating that he had MRIs done and he thinks he could have a neuroma and he was scheduled for surgery by another physician but he wants to see me.  States at times it burns at times it is just pain and it seems to be getting more consistent right over left foot neuro   ROS      Objective:  Physical Exam  Vascular status intact with pain is mostly centralized in the second interspace right but also mild to moderate in the lesser MPJs second and third right with no positive current Mulder sign and negative in the third interspace also     Assessment:  Possibility for neuroma second interspace right foot versus inflammatory capsulitis     Plan:  H&P reviewed condition at great length and the fact that the MRI is inconclusive.  At this point I did do a sterile prep Lilia Pro to try a neurolysis dehydrated alcohols purified alcohol solution which was administered 1.5 cc second interspace that 1 see the results in 2 weeks and we will decide whether surgical intervention would be best for another treatment plan might be of best benefit to this patient.  Also discussed his history of gout we reviewed the spots to that  X-rays dated today indicated the second and third metatarsals are slightly close to each other and very mildly elongated no signs of arthritis stress fracture

## 2019-12-11 ENCOUNTER — Other Ambulatory Visit: Payer: Self-pay

## 2019-12-11 ENCOUNTER — Encounter: Payer: Self-pay | Admitting: Podiatry

## 2019-12-11 ENCOUNTER — Ambulatory Visit: Payer: 59 | Admitting: Podiatry

## 2019-12-11 DIAGNOSIS — D3610 Benign neoplasm of peripheral nerves and autonomic nervous system, unspecified: Secondary | ICD-10-CM | POA: Diagnosis not present

## 2019-12-11 NOTE — Progress Notes (Signed)
Subjective:   Patient ID: Terry Tate, male   DOB: 64 y.o.   MRN: [id number]   HPI Patient presents stating it really seemed to help and I have noticed more pain down the left one in the right 1 even though they both still hurt but the left one is definitely bothering him more now   ROS      Objective:  Physical Exam  Neurovascular status intact with patient found to have continued inflammation pain second interspace bilateral with left being worse that has moderated the right with previous treatment     Assessment:  Appears to be neuroma symptomatology over inflammatory capsulitis based on the response to conservative care     Plan:  H&P reviewed condition sterile prep done bilateral and injected the second interspace bilateral with purified alcohol Marcaine solution 1.5 cc with sterile dressings applied

## 2020-01-08 ENCOUNTER — Ambulatory Visit: Payer: 59 | Admitting: Podiatry

## 2020-01-08 ENCOUNTER — Encounter: Payer: Self-pay | Admitting: Podiatry

## 2020-01-08 ENCOUNTER — Other Ambulatory Visit: Payer: Self-pay

## 2020-01-08 DIAGNOSIS — D3610 Benign neoplasm of peripheral nerves and autonomic nervous system, unspecified: Secondary | ICD-10-CM

## 2020-01-08 NOTE — Progress Notes (Signed)
Subjective:   Patient ID: Terry Tate, male   DOB: 64 y.o.   MRN: [id number]   HPI Patient presents stating I am doing some better but still having pain   ROS      Objective:  Physical Exam  Neurovascular status intact with patient continuing to have discomfort second interspace bilateral with shooting radiating discomfort into the adjacent digits     Assessment:  Continued neuroma symptomatology gradual improvement but pain     Plan:  Went ahead today did sterile prep and injected each interspace with a purified alcohol Marcaine solution of 1.5 cc in length bilateral

## 2020-02-19 ENCOUNTER — Ambulatory Visit: Payer: 59 | Admitting: Podiatry

## 2020-02-19 ENCOUNTER — Other Ambulatory Visit: Payer: Self-pay

## 2020-02-19 ENCOUNTER — Encounter: Payer: Self-pay | Admitting: Podiatry

## 2020-02-19 DIAGNOSIS — G5762 Lesion of plantar nerve, left lower limb: Secondary | ICD-10-CM | POA: Diagnosis not present

## 2020-02-19 DIAGNOSIS — G5761 Lesion of plantar nerve, right lower limb: Secondary | ICD-10-CM | POA: Diagnosis not present

## 2020-02-19 DIAGNOSIS — D3610 Benign neoplasm of peripheral nerves and autonomic nervous system, unspecified: Secondary | ICD-10-CM

## 2020-02-19 NOTE — Progress Notes (Signed)
Subjective:   Patient ID: Terry Tate, male   DOB: 64 y.o.   MRN: [id number]   HPI Patient presents stating overall doing pretty good but I get an occasional zinger pain and a week ago it seemed to get a little bit worse for a couple days.  I think 1 more injection may get   ROS      Objective:  Physical Exam  Neuro vascular status intact with symptoms still present third interspace bilateral with significant improvement from before we start     Assessment:  Neuroma symptomatology bilateral improved present     Plan:  Reviewed condition sterile prep done and did 1 final neurolysis injection in each third interspace with a purified alcohol Marcaine solution and applied sterile dressing.  Reappoint for [place] to recheck

## 2020-04-02 ENCOUNTER — Ambulatory Visit: Payer: 59 | Admitting: Podiatry

## 2020-04-02 ENCOUNTER — Other Ambulatory Visit: Payer: Self-pay

## 2020-04-02 ENCOUNTER — Ambulatory Visit (INDEPENDENT_AMBULATORY_CARE_PROVIDER_SITE_OTHER): Payer: 59

## 2020-04-02 ENCOUNTER — Encounter: Payer: Self-pay | Admitting: Podiatry

## 2020-04-02 DIAGNOSIS — S92912A Unspecified fracture of left toe(s), initial encounter for closed fracture: Secondary | ICD-10-CM | POA: Diagnosis not present

## 2020-04-03 NOTE — Progress Notes (Signed)
Subjective:   Patient ID: Terry Tate, male   DOB: 64 y.o.   MRN: [id number]   HPI Patient states that he has had significant trauma to his left foot with a dog putting full weight of 90 pounds down on it and is concerned about fracture with having had previous surgery on this foot   ROS      Objective:  Physical Exam  Neuro vascular status intact with quite a bit of midfoot inflammation pain left secondary to trauma which occurred several days ago     Assessment:  Trauma to the left foot with possibility of fracture or dislocation of previous fixation device     Plan:  H&P x-ray reviewed and advised on ice therapy boot usage that he has and gradual increase in activity levels.  Patient will be seen back if symptoms do not get better in the next 2 to 4 weeks  X-rays indicate that there does not appear to be a fracture of the midfoot left with all fixation in place and does not appear to be damage

## 2020-09-04 ENCOUNTER — Encounter: Payer: Self-pay | Admitting: Neurology

## 2020-10-12 ENCOUNTER — Other Ambulatory Visit: Payer: Self-pay

## 2020-10-12 DIAGNOSIS — R202 Paresthesia of skin: Secondary | ICD-10-CM

## 2020-10-14 ENCOUNTER — Other Ambulatory Visit: Payer: Self-pay

## 2020-10-14 ENCOUNTER — Ambulatory Visit: Payer: 59 | Admitting: Neurology

## 2020-10-14 DIAGNOSIS — M5417 Radiculopathy, lumbosacral region: Secondary | ICD-10-CM

## 2020-10-14 DIAGNOSIS — R202 Paresthesia of skin: Secondary | ICD-10-CM

## 2020-10-14 NOTE — Procedures (Signed)
[facility]  [address]  [place], [place] [address] Tel: [phone] Fax:  [phone] Test Date:  10/14/2020  Patient: Terry Tate DOB: 1956-06-09 Physician: Narda Amber, DO  Sex: Male Height: 6' " Ref Phys: Delanna Ahmadi, M.D.  ID#: [id number]   Technician:    Patient Complaints: This is a 65 year old man referred for evaluation of bilateral feet paresthesias.  NCV & EMG Findings: Extensive electrodiagnostic testing of the right lower extremity and additional studies of the left shows:  1. Bilateral sural and superficial peroneal sensory responses are within normal limits. 2. Bilateral peroneal (EDB) and tibial motor responses show reduced amplitude (R1.5, L1.7, R2.1, L2.4 mV).  Bilateral peroneal motor responses at the tibialis anterior is within normal limits.   3. Bilateral tibial H reflex studies show prolonged latency.   4. Chronic motor axonal loss changes are seen affecting the L5-S1 myotomes bilaterally, without accompanied active denervation.    Impression: 1. Chronic L5-S1 radiculopathy affecting bilateral lower extremities, mild-to-moderate. 2. There is no evidence of a sensorimotor polyneuropathy affecting the lower extremities.   ___________________________ Narda Amber, DO    Nerve Conduction Studies Anti Sensory Summary Table   Stim Site NR Peak (ms) Norm Peak (ms) P-T Amp (V) Norm P-T Amp  Left Sup Peroneal Anti Sensory (Ant Lat Mall)  32C  12 cm    3.3 <4.6 7.3 >3  Right Sup Peroneal Anti Sensory (Ant Lat Mall)  32C  12 cm    2.8 <4.6 8.9 >3  Left Sural Anti Sensory (Lat Mall)  32C  Calf    4.0 <4.6 13.3 >3  Right Sural Anti Sensory (Lat Mall)  32C  Calf    3.8 <4.6 9.3 >3   Motor Summary Table   Stim Site NR Onset (ms) Norm Onset (ms) O-P Amp (mV) Norm O-P Amp Site1 Site2 Delta-0 (ms) Dist (cm) Vel (m/s) Norm Vel (m/s)  Left Peroneal Motor (Ext Dig Brev)  32C  Ankle    5.1 <6.0 1.7 >2.5 B Fib Ankle 9.0 38.0 42  >40  B Fib    14.1  1.4  Poplt B Fib 2.5 10.0 40 >40  Poplt    16.6  1.4         Right Peroneal Motor (Ext Dig Brev)  32C  Ankle    5.0 <6.0 1.5 >2.5 B Fib Ankle 9.8 39.0 40 >40  B Fib    14.8  1.6  Poplt B Fib 2.5 10.0 40 >40  Poplt    17.3  1.5         Left Peroneal TA Motor (Tib Ant)  32C  Fib Head    3.6 <4.5 6.0 >3 Poplit Fib Head 1.5 10.0 67 >40  Poplit    5.1  6.0         Right Peroneal TA Motor (Tib Ant)  32C  Fib Head    3.0 <4.5 6.1 >3 Poplit Fib Head 1.5 10.0 67 >40  Poplit    4.5  6.0         Left Tibial Motor (Abd Hall Brev)  32C  Ankle    4.1 <6.0 2.4 >4 Knee Ankle 9.4 43.0 46 >40  Knee    13.5  2.2         Right Tibial Motor (Abd Hall Brev)  32C  Ankle    4.6 <6.0 2.1 >4 Knee Ankle 11.2 45.0 40 >40  Knee    15.8  1.3  H Reflex Studies   NR H-Lat (ms) Lat Norm (ms) L-R H-Lat (ms)  Left Tibial (Gastroc)  32C     38.23 <35 2.31  Right Tibial (Gastroc)  32C     40.54 <35 2.31   EMG   Side Muscle Ins Act Fibs Psw Fasc Number Recrt Dur Dur. Amp Amp. Poly Poly. Comment  Right AntTibialis Nml Nml Nml Nml 1- Rapid Some 1+ Some 1+ Some 1+ N/A  Right Gastroc Nml Nml Nml Nml 1- Rapid Some 1+ Some 1+ Some 1+ N/A  Right Flex Dig Long Nml Nml Nml Nml 1- Rapid Some 1+ Some 1+ Some 1+ N/A  Right RectFemoris Nml Nml Nml Nml Nml Nml Nml Nml Nml Nml Nml Nml N/A  Right GluteusMed Nml Nml Nml Nml 1- Rapid Some 1+ Some 1+ Some 1+ N/A  Right BicepsFemS Nml Nml Nml Nml 1- Rapid Some 1+ Some 1+ Some 1+ N/A  Left BicepsFemS Nml Nml Nml Nml 1- Rapid Some 1+ Some 1+ Some 1+ N/A  Left AntTibialis Nml Nml Nml Nml 1- Rapid Some 1+ Some 1+ Some 1+ N/A  Left Gastroc Nml Nml Nml Nml 1- Rapid Some 1+ Some 1+ Some 1+ N/A  Left Flex Dig Long Nml Nml Nml Nml 1- Rapid Some 1+ Some 1+ Some 1+ N/A  Left RectFemoris Nml Nml Nml Nml Nml Nml Nml Nml Nml Nml Nml Nml N/A  Left GluteusMed Nml Nml Nml Nml 1- Rapid Some 1+ Some 1+ Some 1+ N/A      Waveforms:

## 2020-12-31 ENCOUNTER — Other Ambulatory Visit: Payer: Self-pay

## 2020-12-31 ENCOUNTER — Other Ambulatory Visit: Payer: Self-pay | Admitting: Podiatry

## 2020-12-31 ENCOUNTER — Ambulatory Visit (INDEPENDENT_AMBULATORY_CARE_PROVIDER_SITE_OTHER): Payer: 59

## 2020-12-31 ENCOUNTER — Ambulatory Visit: Payer: 59 | Admitting: Podiatry

## 2020-12-31 DIAGNOSIS — M79671 Pain in right foot: Secondary | ICD-10-CM

## 2020-12-31 DIAGNOSIS — R931 Abnormal findings on diagnostic imaging of heart and coronary circulation: Secondary | ICD-10-CM | POA: Insufficient documentation

## 2020-12-31 DIAGNOSIS — Z8546 Personal history of malignant neoplasm of prostate: Secondary | ICD-10-CM | POA: Insufficient documentation

## 2020-12-31 DIAGNOSIS — N200 Calculus of kidney: Secondary | ICD-10-CM | POA: Insufficient documentation

## 2020-12-31 DIAGNOSIS — M779 Enthesopathy, unspecified: Secondary | ICD-10-CM

## 2020-12-31 DIAGNOSIS — K5730 Diverticulosis of large intestine without perforation or abscess without bleeding: Secondary | ICD-10-CM | POA: Insufficient documentation

## 2020-12-31 DIAGNOSIS — G5760 Lesion of plantar nerve, unspecified lower limb: Secondary | ICD-10-CM | POA: Insufficient documentation

## 2020-12-31 DIAGNOSIS — E669 Obesity, unspecified: Secondary | ICD-10-CM | POA: Insufficient documentation

## 2020-12-31 DIAGNOSIS — M6530 Trigger finger, unspecified finger: Secondary | ICD-10-CM | POA: Insufficient documentation

## 2020-12-31 DIAGNOSIS — M1A00X0 Idiopathic chronic gout, unspecified site, without tophus (tophi): Secondary | ICD-10-CM | POA: Insufficient documentation

## 2020-12-31 DIAGNOSIS — E291 Testicular hypofunction: Secondary | ICD-10-CM | POA: Insufficient documentation

## 2020-12-31 DIAGNOSIS — G4733 Obstructive sleep apnea (adult) (pediatric): Secondary | ICD-10-CM | POA: Insufficient documentation

## 2020-12-31 DIAGNOSIS — M778 Other enthesopathies, not elsewhere classified: Secondary | ICD-10-CM

## 2020-12-31 DIAGNOSIS — G4700 Insomnia, unspecified: Secondary | ICD-10-CM | POA: Insufficient documentation

## 2020-12-31 DIAGNOSIS — H9319 Tinnitus, unspecified ear: Secondary | ICD-10-CM | POA: Insufficient documentation

## 2020-12-31 DIAGNOSIS — E7800 Pure hypercholesterolemia, unspecified: Secondary | ICD-10-CM | POA: Insufficient documentation

## 2020-12-31 DIAGNOSIS — F5104 Psychophysiologic insomnia: Secondary | ICD-10-CM | POA: Insufficient documentation

## 2020-12-31 DIAGNOSIS — G609 Hereditary and idiopathic neuropathy, unspecified: Secondary | ICD-10-CM

## 2020-12-31 DIAGNOSIS — J302 Other seasonal allergic rhinitis: Secondary | ICD-10-CM | POA: Insufficient documentation

## 2020-12-31 DIAGNOSIS — M79672 Pain in left foot: Secondary | ICD-10-CM

## 2021-01-01 ENCOUNTER — Other Ambulatory Visit: Payer: Self-pay | Admitting: Internal Medicine

## 2021-01-01 DIAGNOSIS — M79671 Pain in right foot: Secondary | ICD-10-CM

## 2021-01-03 NOTE — Progress Notes (Signed)
Subjective:   Patient ID: Molli Knock, male   DOB: 65 y.o.   MRN: [id number]   HPI Patient presents stating he has been developing a lot of burning shooting pains in the outside of his feet and legs and he had a nerve conduction that was negative but it is continuing to bother him.  He has started gabapentin low milligram dosage   ROS      Objective:  Physical Exam  Vascular status intact neurological appears to be moderately diminished sharp dull vibratory bilateral with patient getting burning especially at nighttime that makes sleeping difficult at times     Assessment:  Cannot rule out that we may not be dealing with some kind of neuropathy here versus any kind of organic nerve condition or just generalized inflammatory condition     Plan:  H&P x-rays reviewed discussed.  At this point I am good to do nerve biopsies to try to gain more information and I did sterile prep of each lateral lower leg about 10 cm above the lateral malleolus I injected with 30 mg Xylocaine epinephrine and did nerve biopsies of the lateral side bilateral sterile dressings were applied and we will see back again in 6 weeks I placed him in tubes for evaluation and DNA testing  X-rays were negative for signs that there is any kind of advanced arthritis stress fracture or mechanical dysfunction

## 2021-01-06 ENCOUNTER — Ambulatory Visit
Admission: RE | Admit: 2021-01-06 | Discharge: 2021-01-06 | Disposition: A | Discharge status: Home / Self Care | Payer: 59 | Source: Ambulatory Visit | Attending: Internal Medicine | Admitting: Internal Medicine

## 2021-01-06 DIAGNOSIS — M79671 Pain in right foot: Secondary | ICD-10-CM

## 2021-01-07 ENCOUNTER — Other Ambulatory Visit: Payer: 59

## 2021-01-08 ENCOUNTER — Encounter: Payer: Self-pay | Admitting: Podiatry

## 2021-02-11 ENCOUNTER — Ambulatory Visit: Payer: 59 | Admitting: Podiatry

## 2021-02-12 ENCOUNTER — Other Ambulatory Visit: Payer: Self-pay

## 2021-02-12 ENCOUNTER — Ambulatory Visit: Payer: 59 | Admitting: Podiatry

## 2021-02-12 ENCOUNTER — Encounter: Payer: Self-pay | Admitting: Podiatry

## 2021-02-12 DIAGNOSIS — G609 Hereditary and idiopathic neuropathy, unspecified: Secondary | ICD-10-CM | POA: Diagnosis not present

## 2021-02-12 MED ORDER — PREGABALIN 75 MG PO CAPS: 75.0000 mg | ORAL_CAPSULE | Freq: Two times a day (BID) | ORAL | 4 refills | Status: DC

## 2021-02-12 NOTE — Progress Notes (Signed)
Subjective:   Patient ID: Molli Knock, male   DOB: 65 y.o.   MRN: [id number]   HPI Patient presents stating that he still is having the same types of symptoms wants to review the results of biopsies and see what other course we could consider   ROS      Objective:  Physical Exam  Neurovascular status intact with mild diminishment of neuro neuropathic-like feeling and diminishment sharp dull vibratory with results of pathology indicating mild small fiber disease     Assessment:  Probability for small fiber neuropathy bilateral     Plan:  H&P reviewed condition recommended the continuation of conservative care and added several vitamins I want him to take it and working to try him on Lyrica at this time 75 mg twice daily.  He will keep this up for several months and we will see the results of utilizing this

## 2021-07-05 ENCOUNTER — Other Ambulatory Visit: Payer: Self-pay

## 2021-07-05 ENCOUNTER — Ambulatory Visit: Payer: 59 | Admitting: Podiatry

## 2021-07-05 ENCOUNTER — Telehealth: Payer: Self-pay | Admitting: *Deleted

## 2021-07-05 DIAGNOSIS — G609 Hereditary and idiopathic neuropathy, unspecified: Secondary | ICD-10-CM

## 2021-07-05 MED ORDER — DICLOFENAC SODIUM 75 MG PO TBEC: 75.0000 mg | DELAYED_RELEASE_TABLET | Freq: Two times a day (BID) | ORAL | 2 refills | Status: DC

## 2021-07-05 MED ORDER — PREDNISONE 10 MG PO TABS: ORAL_TABLET | ORAL | 0 refills | Status: DC

## 2021-07-05 NOTE — Telephone Encounter (Signed)
After he finishes the dospak

## 2021-07-05 NOTE — Telephone Encounter (Signed)
Patient is calling for clarification on his medications prescribed today, should he take the diclofenac after finishing the prednisone 12 day tapering dose or can he take both together?Please advise.

## 2021-07-05 NOTE — Progress Notes (Signed)
Subjective:   Patient ID: Terry Tate, male   DOB: 65 y.o.   MRN: [id number]   HPI Patient presents with continued nervelike problems with both feet.  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  This is very difficult due to his failure of response and I have recommended long-term to consider Qutenza treatment and I reviewed this as a chance to give him relief and not require surgical intervention.  Patient will be seen back to recheck for this and we will get approval to have this done and will continue with anti-inflammatories topical medicines to the best he can

## 2021-07-05 NOTE — Telephone Encounter (Signed)
Returned the call to patient, gave instructions per Dr Regal,verbalized understanding.

## 2021-07-16 ENCOUNTER — Telehealth: Payer: Self-pay | Admitting: Podiatry

## 2021-07-16 NOTE — Telephone Encounter (Signed)
My qutenza representative called and stated that the Medication Qutenza needs to be requested the [facility] portal for the administration [place] [phone]

## 2021-07-21 NOTE — Telephone Encounter (Signed)
Should be able to get this approved

## 2021-07-22 NOTE — Telephone Encounter (Signed)
He has been approved. Please schedule him to have treatment. Does not need to be on my schedule

## 2021-08-06 ENCOUNTER — Other Ambulatory Visit: Payer: Self-pay

## 2021-08-06 ENCOUNTER — Encounter: Payer: Self-pay | Admitting: Podiatry

## 2021-08-06 ENCOUNTER — Ambulatory Visit: Payer: 59 | Admitting: Podiatry

## 2021-08-06 DIAGNOSIS — M79671 Pain in right foot: Secondary | ICD-10-CM | POA: Diagnosis not present

## 2021-08-06 DIAGNOSIS — M79672 Pain in left foot: Secondary | ICD-10-CM | POA: Diagnosis not present

## 2021-08-06 DIAGNOSIS — G609 Hereditary and idiopathic neuropathy, unspecified: Secondary | ICD-10-CM | POA: Diagnosis not present

## 2021-08-06 NOTE — Progress Notes (Signed)
Subjective:   Patient ID: Terry Tate, male   DOB: 65 y.o.   MRN: [id number]   HPI Patient presents stating that he has had problems with neuropathy and it is worse when he is at nighttime and he has tried Lyrica gabapentin without relief and at times it makes it difficult to sleep.  Currently using oral anti-inflammatory topical Voltaren   ROS      Objective:  Physical Exam  Neurovascular status intact with patient having chronic neuropathic-like symptomatology bilateral that has gotten worse over period of time     Assessment:  Idiopathic peripheral neuropathy bilateral     Plan:  H&P reviewed condition at great length and I do think Qutenza would still be the best long-term alternative for him.  We are going to try to get him approved and at this point I did write for a topical compounding cream to try to help and I spent a great deal time going over this with him and the complexity of the condition and other modalities at 1 point in future that could be considered

## 2021-08-30 ENCOUNTER — Telehealth: Payer: Self-pay | Admitting: *Deleted

## 2021-08-30 NOTE — Telephone Encounter (Signed)
Wasn't the company going to check on status? Can this be appealed for him

## 2021-08-30 NOTE — Telephone Encounter (Signed)
Patient is calling to see if he is a candidate for the [place] program, was told to call back in 2 weeks. Please advise.

## 2021-09-02 NOTE — Telephone Encounter (Signed)
Called patient and he said that no number was given to him to contact them. Sue Lush) He thought that someone from our office would contact. Please contact.

## 2021-09-02 NOTE — Telephone Encounter (Signed)
Please let him know

## 2022-12-27 ENCOUNTER — Encounter: Payer: Self-pay | Admitting: Gastroenterology

## 2023-03-10 ENCOUNTER — Ambulatory Visit: Payer: 59 | Admitting: Gastroenterology

## 2023-04-06 IMAGING — MR MR LUMBAR SPINE W/O CM
4 of 5 series · 27 of 48 positions shown · non-contrast
Comparison: CT abdomen and pelvis 11/07/2018.

CLINICAL DATA: Bilateral foot pain and numbness.

EXAM:
MRI LUMBAR SPINE WITHOUT CONTRAST
TECHNIQUE: Multiplanar, multisequence MR imaging of the lumbar spine was
performed. No intravenous contrast was administered.

[Series 2: T2 · sagittal · 4.0mm · 1.09mm/px · 6 of 17 slices shown (1 of 2)]
[im 1/17]
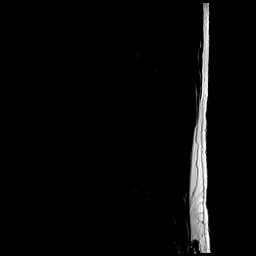
[im 4/17]
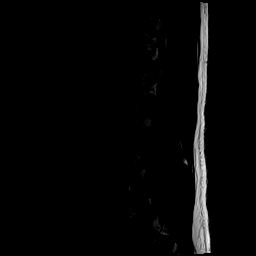
[im 7/17]
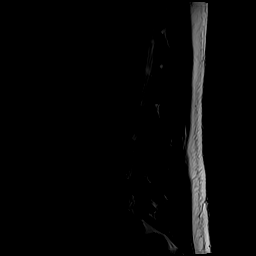
[im 10/17]
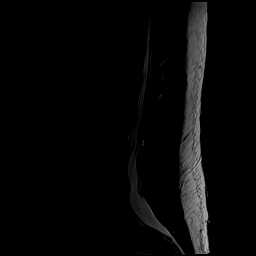
[im 13/17]
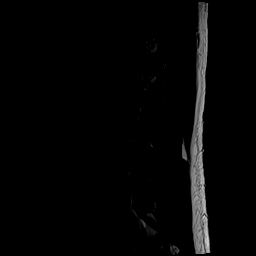
[im 17/17]
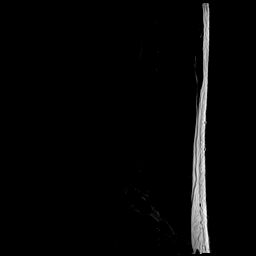

[Series 4: T1 · sagittal · 4.0mm · 1.09mm/px · 6 of 17 slices shown (1 of 2)]
[im 1/17]
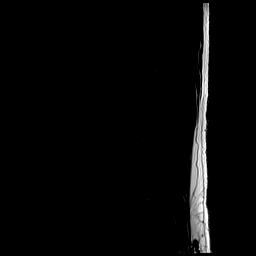
[im 4/17]
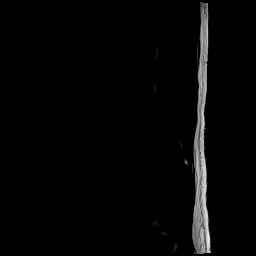
[im 7/17]
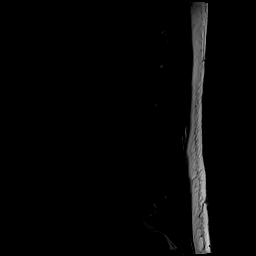
[im 10/17]
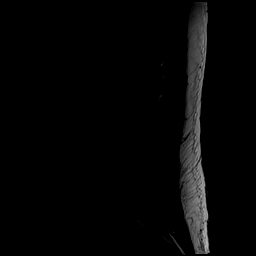
[im 13/17]
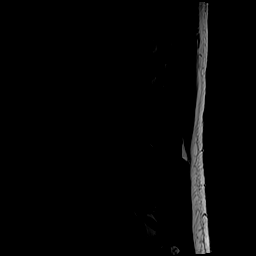
[im 17/17]
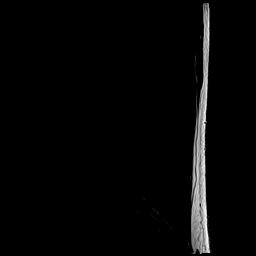

[Series 5: T2 · axial · 4.0mm · 0.39mm/px · z∈[-85,+147]mm · 9 of 46 slices shown (2 of 2)]
[im 1/46]
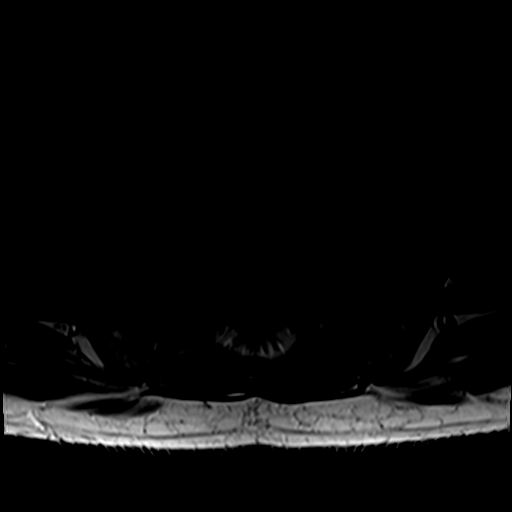
[im 7/46]
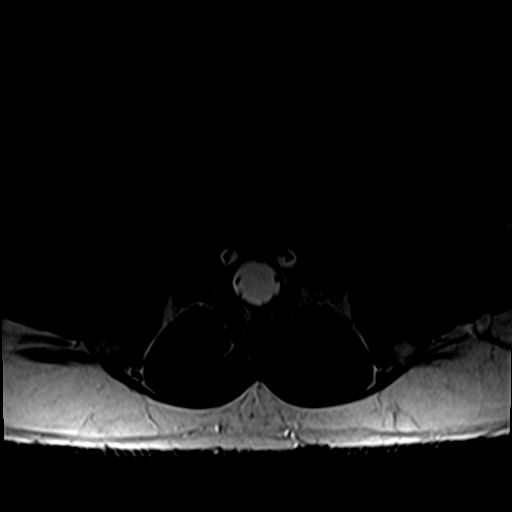
[im 13/46]
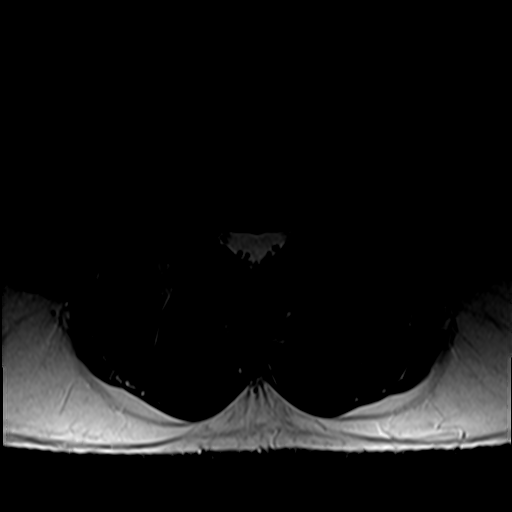
[im 20/46]
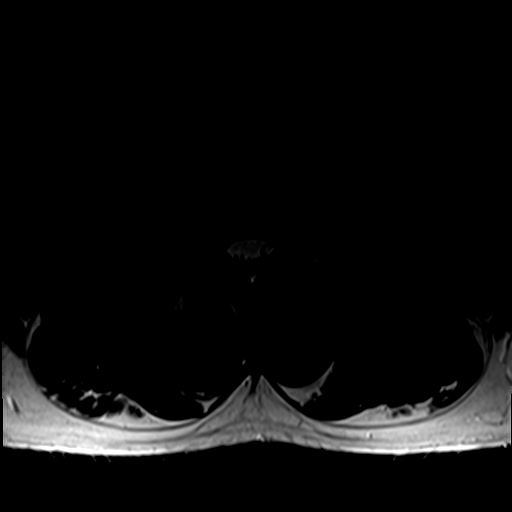
[im 23/46]
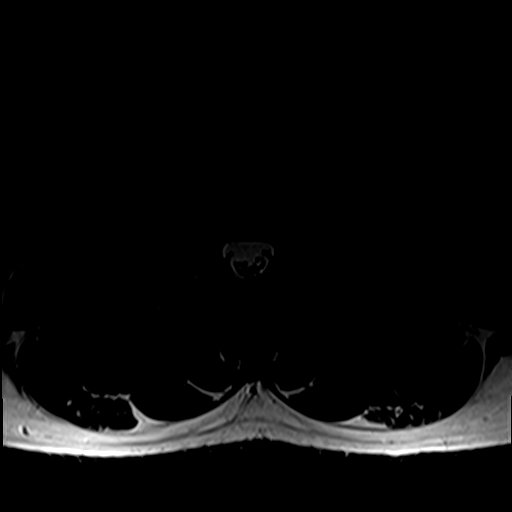
[im 26/46]
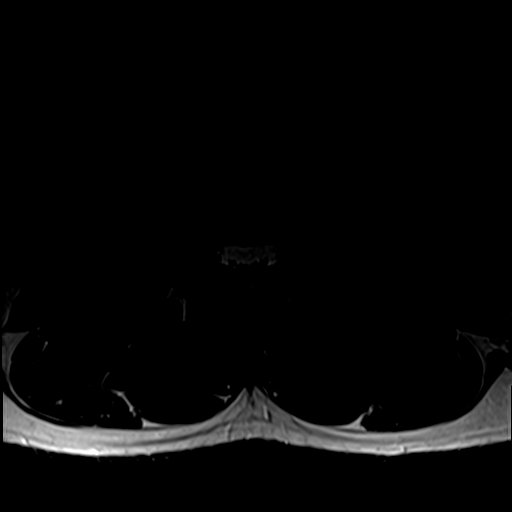
[im 33/46]
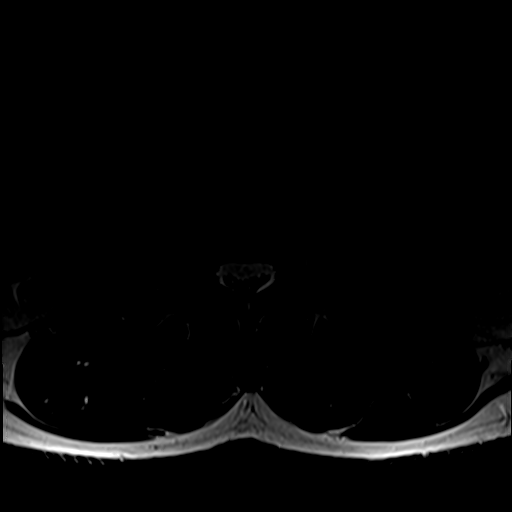
[im 39/46]
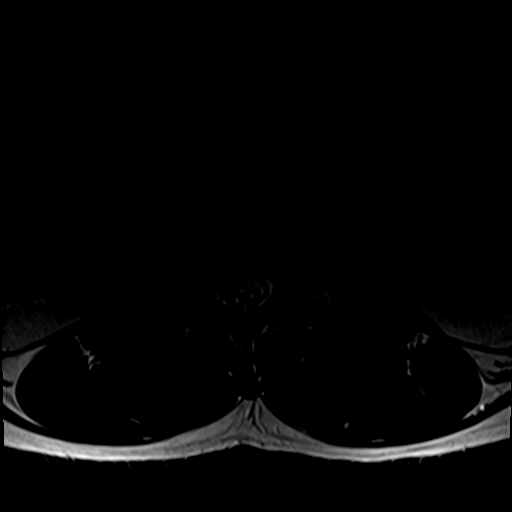
[im 46/46]
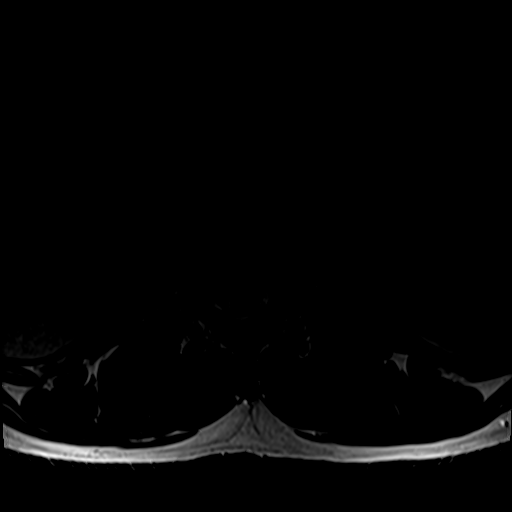

[Series 6: T1 · axial · 4.0mm · 0.39mm/px · z∈[-85,+114]mm · 6 of 46 slices shown (2 of 2)]
[im 1/46]
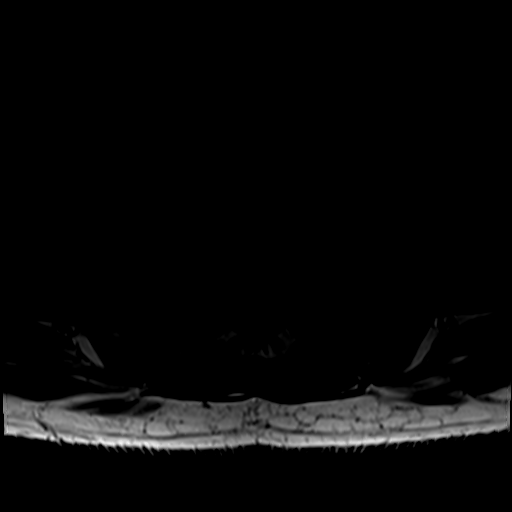
[im 7/46]
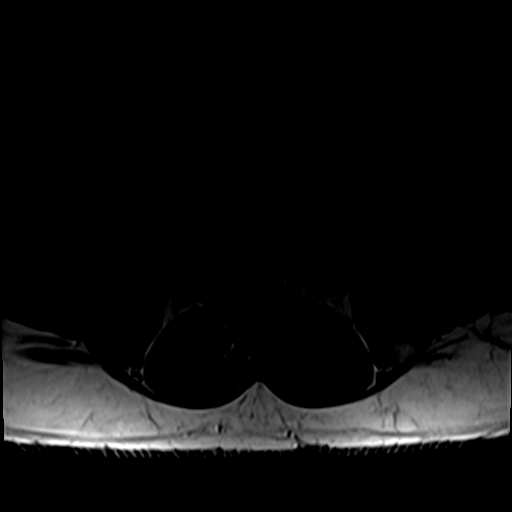
[im 13/46]
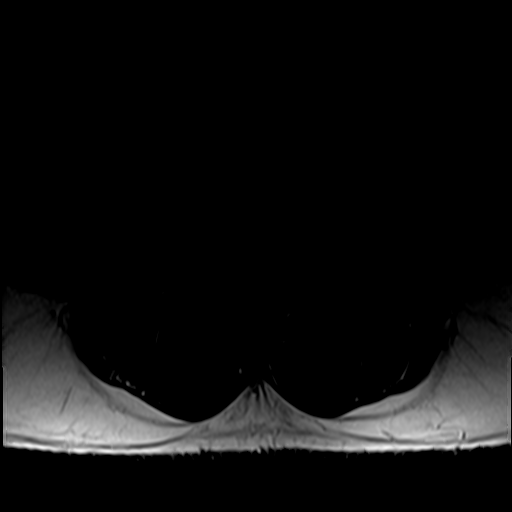
[im 20/46]
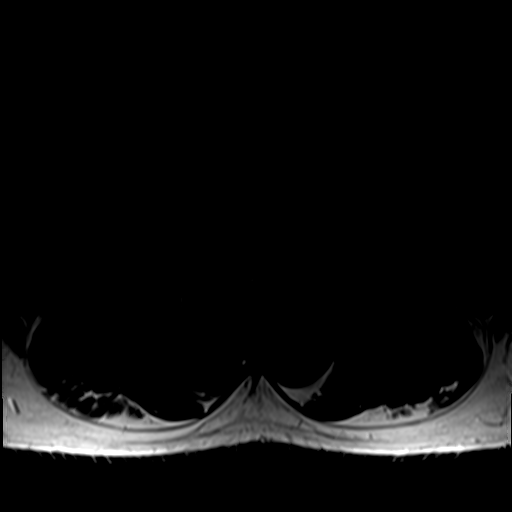
[im 23/46]
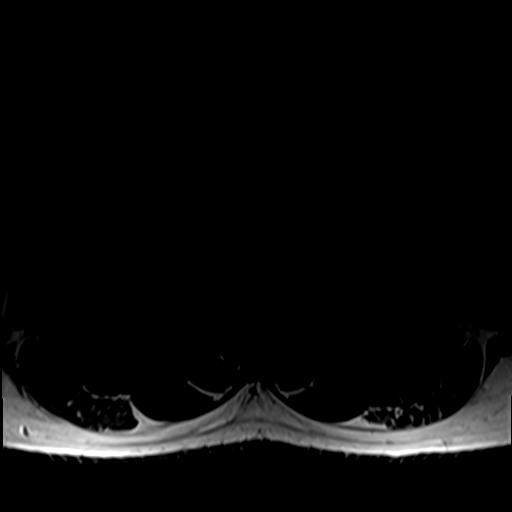
[im 39/46]
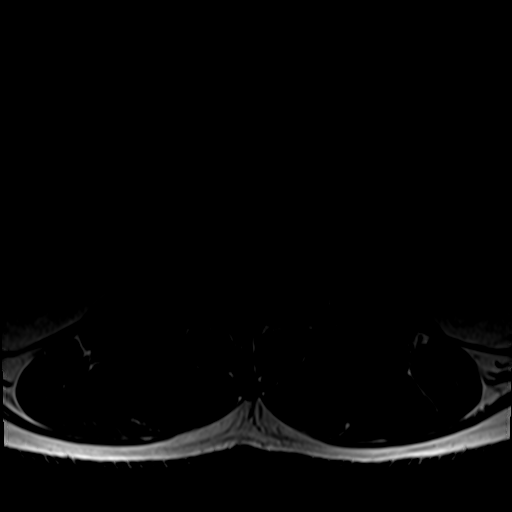

[27 of 48 positions shown; findings below may reference images not displayed]

FINDINGS: Segmentation:  Standard.

Alignment:  Normal.

Vertebrae:  No fracture, evidence of discitis, or bone lesion.

Conus medullaris and cauda equina: Conus extends to the L1 level.
Conus and cauda equina appear normal.

Paraspinal and other soft tissues: Small T2 hyperintense lesions in
both kidneys are likely cysts.

Disc levels:

T11-12 is imaged in the sagittal plane only and negative.

T12-L1: Negative.

L1-2: Negative.

L2-3: Shallow disc bulge.  No stenosis.

L3-4: Shallow disc bulge and mild facet degenerative change. The
central canal is open. Disc extending into the foramina is more
prominent on the left where there is a small protrusion in the
inferior aspect of the foramen. Mild foraminal stenosis bilaterally
is more notable on the left.

L4-5: Shallow broad-based disc bulge and mild facet degenerative
disease. Mild central canal and foraminal narrowing.

L5-S1: Shallow disc bulge and a small annular fissure.  No stenosis.
IMPRESSION: Disc bulge and a small left foraminal protrusion at L3-4 causes mild
bilateral foraminal narrowing, worse on the left. No nerve root
compression.

Shallow disc bulge at L4-5 causes mild central canal and bilateral
foraminal narrowing.

## 2023-11-20 ENCOUNTER — Other Ambulatory Visit: Payer: Self-pay | Admitting: Internal Medicine

## 2023-11-20 DIAGNOSIS — M79605 Pain in left leg: Secondary | ICD-10-CM

## 2024-01-03 ENCOUNTER — Other Ambulatory Visit: Payer: Self-pay | Admitting: Internal Medicine

## 2024-01-03 ENCOUNTER — Ambulatory Visit
Admission: RE | Admit: 2024-01-03 | Discharge: 2024-01-03 | Disposition: A | Discharge status: Home / Self Care | Source: Ambulatory Visit | Attending: Internal Medicine | Admitting: Internal Medicine

## 2024-01-03 DIAGNOSIS — M79605 Pain in left leg: Secondary | ICD-10-CM

## 2024-01-22 DIAGNOSIS — M84371A Stress fracture, right ankle, initial encounter for fracture: Secondary | ICD-10-CM | POA: Diagnosis not present

## 2024-01-22 DIAGNOSIS — M722 Plantar fascial fibromatosis: Secondary | ICD-10-CM | POA: Diagnosis not present

## 2024-01-23 DIAGNOSIS — M25562 Pain in left knee: Secondary | ICD-10-CM | POA: Diagnosis not present

## 2024-01-25 DIAGNOSIS — M84371A Stress fracture, right ankle, initial encounter for fracture: Secondary | ICD-10-CM | POA: Diagnosis not present

## 2024-01-25 DIAGNOSIS — M722 Plantar fascial fibromatosis: Secondary | ICD-10-CM | POA: Diagnosis not present

## 2024-02-27 DIAGNOSIS — M84371A Stress fracture, right ankle, initial encounter for fracture: Secondary | ICD-10-CM | POA: Diagnosis not present

## 2024-02-27 DIAGNOSIS — M722 Plantar fascial fibromatosis: Secondary | ICD-10-CM | POA: Diagnosis not present

## 2024-03-19 DIAGNOSIS — E119 Type 2 diabetes mellitus without complications: Secondary | ICD-10-CM | POA: Diagnosis not present

## 2024-03-27 DIAGNOSIS — M9903 Segmental and somatic dysfunction of lumbar region: Secondary | ICD-10-CM | POA: Diagnosis not present

## 2024-03-27 DIAGNOSIS — M9902 Segmental and somatic dysfunction of thoracic region: Secondary | ICD-10-CM | POA: Diagnosis not present

## 2024-03-27 DIAGNOSIS — M5416 Radiculopathy, lumbar region: Secondary | ICD-10-CM | POA: Diagnosis not present

## 2024-03-27 DIAGNOSIS — M5414 Radiculopathy, thoracic region: Secondary | ICD-10-CM | POA: Diagnosis not present

## 2024-03-29 DIAGNOSIS — M9902 Segmental and somatic dysfunction of thoracic region: Secondary | ICD-10-CM | POA: Diagnosis not present

## 2024-03-29 DIAGNOSIS — M9903 Segmental and somatic dysfunction of lumbar region: Secondary | ICD-10-CM | POA: Diagnosis not present

## 2024-03-29 DIAGNOSIS — M5414 Radiculopathy, thoracic region: Secondary | ICD-10-CM | POA: Diagnosis not present

## 2024-03-29 DIAGNOSIS — M5416 Radiculopathy, lumbar region: Secondary | ICD-10-CM | POA: Diagnosis not present

## 2024-04-03 DIAGNOSIS — M5416 Radiculopathy, lumbar region: Secondary | ICD-10-CM | POA: Diagnosis not present

## 2024-04-03 DIAGNOSIS — M5414 Radiculopathy, thoracic region: Secondary | ICD-10-CM | POA: Diagnosis not present

## 2024-04-03 DIAGNOSIS — M9903 Segmental and somatic dysfunction of lumbar region: Secondary | ICD-10-CM | POA: Diagnosis not present

## 2024-04-03 DIAGNOSIS — M9902 Segmental and somatic dysfunction of thoracic region: Secondary | ICD-10-CM | POA: Diagnosis not present

## 2024-04-10 DIAGNOSIS — M9903 Segmental and somatic dysfunction of lumbar region: Secondary | ICD-10-CM | POA: Diagnosis not present

## 2024-04-10 DIAGNOSIS — M9902 Segmental and somatic dysfunction of thoracic region: Secondary | ICD-10-CM | POA: Diagnosis not present

## 2024-04-10 DIAGNOSIS — M5416 Radiculopathy, lumbar region: Secondary | ICD-10-CM | POA: Diagnosis not present

## 2024-04-10 DIAGNOSIS — M5414 Radiculopathy, thoracic region: Secondary | ICD-10-CM | POA: Diagnosis not present

## 2024-04-11 DIAGNOSIS — M25551 Pain in right hip: Secondary | ICD-10-CM | POA: Diagnosis not present

## 2024-04-24 DIAGNOSIS — M25551 Pain in right hip: Secondary | ICD-10-CM | POA: Diagnosis not present

## 2024-04-30 DIAGNOSIS — M25551 Pain in right hip: Secondary | ICD-10-CM | POA: Diagnosis not present

## 2024-05-02 DIAGNOSIS — M25551 Pain in right hip: Secondary | ICD-10-CM | POA: Diagnosis not present

## 2024-05-06 DIAGNOSIS — M25551 Pain in right hip: Secondary | ICD-10-CM | POA: Diagnosis not present

## 2024-05-14 DIAGNOSIS — M25551 Pain in right hip: Secondary | ICD-10-CM | POA: Diagnosis not present

## 2024-05-16 DIAGNOSIS — M25551 Pain in right hip: Secondary | ICD-10-CM | POA: Diagnosis not present

## 2024-05-21 DIAGNOSIS — M25551 Pain in right hip: Secondary | ICD-10-CM | POA: Diagnosis not present

## 2024-05-23 DIAGNOSIS — M1611 Unilateral primary osteoarthritis, right hip: Secondary | ICD-10-CM | POA: Diagnosis not present

## 2024-05-28 DIAGNOSIS — M1611 Unilateral primary osteoarthritis, right hip: Secondary | ICD-10-CM | POA: Diagnosis not present

## 2024-05-28 DIAGNOSIS — M25551 Pain in right hip: Secondary | ICD-10-CM | POA: Diagnosis not present

## 2024-06-04 DIAGNOSIS — M25551 Pain in right hip: Secondary | ICD-10-CM | POA: Diagnosis not present

## 2024-07-10 ENCOUNTER — Telehealth: Payer: Self-pay | Admitting: *Deleted

## 2024-07-10 NOTE — Telephone Encounter (Signed)
 Copied from CRM [id number]. Topic: Clinical - Medical Advice >> Jul 10, 2024  9:45 AM Gustabo D wrote: Pt wants to know if he needs labs done before his appt in Nov Pt wants to know if he needs to sign a release of record for his medical records to come to [facility]ician at [facility]

## 2024-07-10 NOTE — Telephone Encounter (Signed)
 Informed pt that we would take care of getting records at his new pt visit and the provider would determine then if labs are needed and would get at that visit or would have pt come back.

## 2024-08-13 ENCOUNTER — Ambulatory Visit: Admitting: Family Medicine

## 2024-08-13 VITALS — BP 132/73 | HR 72 | Ht 72.0 in | Wt 244.0 lb

## 2024-08-13 DIAGNOSIS — M1A9XX0 Chronic gout, unspecified, without tophus (tophi): Secondary | ICD-10-CM | POA: Diagnosis not present

## 2024-08-13 DIAGNOSIS — F5101 Primary insomnia: Secondary | ICD-10-CM | POA: Diagnosis not present

## 2024-08-13 DIAGNOSIS — E291 Testicular hypofunction: Secondary | ICD-10-CM

## 2024-08-13 DIAGNOSIS — R7303 Prediabetes: Secondary | ICD-10-CM

## 2024-08-13 DIAGNOSIS — Z7689 Persons encountering health services in other specified circumstances: Secondary | ICD-10-CM

## 2024-08-13 DIAGNOSIS — G609 Hereditary and idiopathic neuropathy, unspecified: Secondary | ICD-10-CM

## 2024-08-13 DIAGNOSIS — I10 Essential (primary) hypertension: Secondary | ICD-10-CM

## 2024-08-13 DIAGNOSIS — E7800 Pure hypercholesterolemia, unspecified: Secondary | ICD-10-CM

## 2024-08-13 NOTE — Assessment & Plan Note (Signed)
 Currently on a combination pill. Counseled on the importance of BP control (<130/80-85) for reducing heart attack and stroke risk. - Continue amlodipine-valsartan.

## 2024-08-13 NOTE — Assessment & Plan Note (Signed)
 Takes duloxetine, magnesium, and ALA. Unsure of duloxetine's efficacy but notes gabapentin was ineffective. - Continue current regimen.

## 2024-08-13 NOTE — Assessment & Plan Note (Signed)
 On weekly testosterone  injections. - Continue testosterone  injections. - Will obtain recent CBC and PSA from urologist to monitor for polycythemia and prostate health. Patient has urology appointment tomorrow.

## 2024-08-13 NOTE — Assessment & Plan Note (Signed)
 Patient reports diagnosis from previous provider. Concerned due to strong family history. - Check A1c. - Discuss results and management options at follow-up.

## 2024-08-13 NOTE — Progress Notes (Unsigned)
 New Patient Office Visit  Subjective   Patient ID: Terry Tate, male    DOB: Mar 22, 1956  Age: 68 y.o. MRN: [id number]  CC:  Chief Complaint  Patient presents with   New Patient (Initial Visit)    HPI Terry Tate presents to establish care  Subjective - New patient establishing care. Previously with Dr. Signa for 25 years. Switched providers due to dissatisfaction with care from the new physician after Dr. Vonnie retirement. - Borderline diabetes: Notified by previous office manager about being borderline diabetic and recommended to start metformin. Patient declined and chose to switch providers. Last labs were ~6 months ago. Interested in re-testing and discussing management. - Hyperlipidemia: History of borderline high cholesterol since [employer] days. Was previously taken off cholesterol medication by Dr. Signa. Expressed interest in discontinuing medications if not needed. - Hypertension: History of blood pressure creeping up, leading to medication initiation. - Gout: History of gout, well-controlled on allopurinol . Also notes no kidney stones since starting allopurinol . - Idiopathic peripheral neuropathy: Affects feet. Uses magnesium, ALA, and a TENS unit, which seem to help. - Insomnia: Has been taking Ambien for ~10 years after other sleep aids were ineffective. Reports occasional variability in medication effectiveness. - Hip arthritis: Diagnosed with right hip arthritis in July. Scheduled for a fluoroscopy-guided injection this afternoon.  Medications Testosterone  0.3 mg weekly, zolpidem 12.5 mg, atorvastatin, amlodipine-valsartan, duloxetine, allopurinol  100 mg daily, aspirin 81 mg daily, multivitamin, magnesium, ALA. Reports stopping fish oil ~2 years ago. Reports no current refill needs.  PMH, PSH, FH, Social Hx PMHx: Hypertension, hyperlipidemia, gout, idiopathic peripheral neuropathy, insomnia, borderline diabetes, right hip arthritis, history of kidney  stones. PSH: Cervical discectomy, prostate resection/removal, left foot talonavicular fusion with titanium plate, arthroscopic left rotator cuff repair, eye surgery (spacers for near vision), two left knee arthroscopic surgeries (meniscus and cartilage). No knee or hip replacement. FH: Father had diabetes and a series of TIAs. Mother had COPD and diabetes. Brother has diabetes. Social Hx: Retired emergency planning/management officer. Married with two adult children. No tobacco use. Reports alcohol use on weekends, about 2 drinks per day. Exercises with a stationary bike.  ROS Constitutional: Denies fever, chills. Cardiovascular: Denies chest pain. Musculoskeletal: Reports right hip pain (arthritis). Reports history of muscle aches, questioning differentiation from age-related aches. Reports neuropathy in feet. Neurological: Denies new headaches. Reports history of TIAs in father.    Outpatient Encounter Medications as of 08/13/2024  Medication Sig   allopurinol  (ZYLOPRIM ) 100 MG tablet Take 1 tablet (100 mg total) by mouth daily.   atorvastatin (LIPITOR) 20 MG tablet atorvastatin 20 mg tablet   MAGNESIUM PO Take 1 tablet by mouth daily.   Multiple Vitamin (MULTIVITAMIN PO) Take 1 tablet by mouth daily.   testosterone  cypionate (DEPOTESTOSTERONE CYPIONATE) 200 MG/ML injection 1/2 ml   zolpidem (AMBIEN) 10 MG tablet Take 10 mg by mouth at bedtime as needed for sleep.   [DISCONTINUED] colchicine  0.6 MG tablet Take 1 tablet (0.6 mg total) by mouth daily.   [DISCONTINUED] diclofenac  (VOLTAREN ) 75 MG EC tablet Take 1 tablet (75 mg total) by mouth 2 (two) times daily.   [DISCONTINUED] Omega-3 Fatty Acids (FISH OIL PO) Take 1 tablet by mouth daily.   [DISCONTINUED] predniSONE  (DELTASONE ) 10 MG tablet 12 day tapering dose   [DISCONTINUED] pregabalin  (LYRICA ) 75 MG capsule Take 1 capsule (75 mg total) by mouth 2 (two) times daily.   No facility-administered encounter medications on file as of 08/13/2024.    Past  Medical History:  Diagnosis Date   Antral ulcer    hx of due to foreign body   Arthritis    Diverticulosis    Gilbert's syndrome    Gout    History of colon polyps    Kidney stones     Past Surgical History:  Procedure Laterality Date   COLONOSCOPY  06/16/2017   Diverticulosis of colon.  Internal hemorrhoids.   ESOPHAGOGASTRODUODENOSCOPY  06/02/2003   Antral ulcer likely due to small piece of glass (foreign body). Photodocumentation was obtained. Status post sucessful removal of the embedded glass. No immediate complication   MENISCUS REPAIR     NASAL SEPTUM SURGERY     deviated nasal septum s/p surgery   Radial Prostectomy     UVULOPLASTY     VASECTOMY      No family history on file.  Social History   Socioeconomic History   Marital status: Married    Spouse name: Not on file   Number of children: Not on file   Years of education: Not on file   Highest education level: Bachelor's degree (e.g., BA, AB, BS)  Occupational History   Not on file  Tobacco Use   Smoking status: Never   Smokeless tobacco: Never  Substance and Sexual Activity   Alcohol use: Yes    Comment: occ   Drug use: Not Currently   Sexual activity: Not on file  Other Topics Concern   Not on file  Social History Narrative   Not on file   Social Drivers of Health   Financial Resource Strain: Low Risk  (08/13/2024)   Overall Financial Resource Strain (CARDIA)    Difficulty of Paying Living Expenses: Not hard at all  Food Insecurity: No Food Insecurity (08/13/2024)   Hunger Vital Sign    Worried About Running Out of Food in the Last Year: Never true    Ran Out of Food in the Last Year: Never true  Transportation Needs: No Transportation Needs (08/13/2024)   PRAPARE - Administrator, Civil Service (Medical): No    Lack of Transportation (Non-Medical): No  Physical Activity: Insufficiently Active (08/13/2024)   Exercise Vital Sign    Days of Exercise per Week: 3 days    Minutes of  Exercise per Session: 30 min  Stress: No Stress Concern Present (08/13/2024)   [employer] of Occupational Health - Occupational Stress Questionnaire    Feeling of Stress: Only a little  Social Connections: Moderately Isolated (08/13/2024)   Social Connection and Isolation Panel    Frequency of Communication with Friends and Family: Twice a week    Frequency of Social Gatherings with Friends and Family: Once a week    Attends Religious Services: Patient declined    Database Administrator or Organizations: No    Attends Engineer, Structural: Not on file    Marital Status: Married  Catering Manager Violence: Not on file    ROS     Objective   BP 132/73   Pulse 72   Ht 6' (1.829 m)   Wt 244 lb (110.7 kg)   SpO2 96%   BMI 33.09 kg/m   Physical Exam Gen: alert, oriented HEENT: perrla, eomi, mmm CV: rrr, no murmur Pulm: lctab. No wheeze or crackles.  GI: soft, nbs.  Nontender to palpation MSK: strength equal b/l. Normal gait Ext: no pedal edema Skin: warm and dry, no rashes Psych: pleasant affect.  Spontaneous speech     Assessment & Plan:   Encounter  to establish care  Chronic gouty arthritis Assessment & Plan: Well-controlled on current medication. No flares since starting allopurinol . Also reports resolution of kidney stones since starting this medication - Continue allopurinol  100 mg daily. - Check uric acid level.   Prediabetes Assessment & Plan: Patient reports diagnosis from previous provider. Concerned due to strong family history. - Check A1c. - Discuss results and management options at follow-up.   Idiopathic neuropathy Assessment & Plan: Takes duloxetine, magnesium, and ALA. Unsure of duloxetine's efficacy but notes gabapentin was ineffective. - Continue current regimen.   Primary insomnia Assessment & Plan: Chronic, managed with zolpidem for ~10 years. - Continue zolpidem 12.5 mg as needed for sleep.   Male  hypogonadism Assessment & Plan:  On weekly testosterone  injections. - Continue testosterone  injections. - Will obtain recent CBC and PSA from urologist to monitor for polycythemia and prostate health. Patient has urology appointment tomorrow.   Essential hypertension Assessment & Plan: Currently on a combination pill. Counseled on the importance of BP control (<130/80-85) for reducing heart attack and stroke risk. - Continue amlodipine-valsartan.   Hypercholesterolemia Assessment & Plan:  Longstanding history, even at a younger age and healthier weight. LDL goal <70, potentially <44. Counseled on the low probability of controlling with diet/exercise alone at this stage and the increased risk of cardiac events with age. Discussed risks/benefits of statins, including low risk of myositis, liver injury, and lack of definitive link to dementia. - Continue atorvastatin. - Check lipid panel.     Return in about 1 year (around 08/13/2025) for physical.   Toribio MARLA Slain, MD

## 2024-08-13 NOTE — Assessment & Plan Note (Signed)
 Longstanding history, even at a younger age and healthier weight. LDL goal <70, potentially <44. Counseled on the low probability of controlling with diet/exercise alone at this stage and the increased risk of cardiac events with age. Discussed risks/benefits of statins, including low risk of myositis, liver injury, and lack of definitive link to dementia. - Continue atorvastatin. - Check lipid panel.

## 2024-08-13 NOTE — Patient Instructions (Signed)
 It was nice to see you today,  We addressed the following topics today: - Please have your urologist's office send a copy of your lab results from your appointment tomorrow. This will help us  avoid repeating tests. - please schedule a lab visit in the next week - Continue your current medications as prescribed. We will not make any changes until we have reviewed your lab work.  Have a great day,  Rolan Slain, MD

## 2024-08-13 NOTE — Assessment & Plan Note (Signed)
 Chronic, managed with zolpidem for ~10 years. - Continue zolpidem 12.5 mg as needed for sleep.

## 2024-08-13 NOTE — Assessment & Plan Note (Signed)
 Well-controlled on current medication. No flares since starting allopurinol . Also reports resolution of kidney stones since starting this medication - Continue allopurinol  100 mg daily. - Check uric acid level.

## 2024-08-21 ENCOUNTER — Other Ambulatory Visit

## 2024-08-21 DIAGNOSIS — M1A9XX0 Chronic gout, unspecified, without tophus (tophi): Secondary | ICD-10-CM

## 2024-08-21 DIAGNOSIS — E7800 Pure hypercholesterolemia, unspecified: Secondary | ICD-10-CM

## 2024-08-21 DIAGNOSIS — G609 Hereditary and idiopathic neuropathy, unspecified: Secondary | ICD-10-CM

## 2024-08-21 DIAGNOSIS — E291 Testicular hypofunction: Secondary | ICD-10-CM

## 2024-08-21 DIAGNOSIS — R7303 Prediabetes: Secondary | ICD-10-CM

## 2024-08-21 DIAGNOSIS — I10 Essential (primary) hypertension: Secondary | ICD-10-CM

## 2024-08-22 ENCOUNTER — Ambulatory Visit: Payer: Self-pay | Admitting: Family Medicine

## 2024-08-22 LAB — COMPREHENSIVE METABOLIC PANEL WITH GFR
ALT: 30 IU/L (ref 0–44)
AST: 27 IU/L (ref 0–40)
Albumin: 4.4 g/dL (ref 3.9–4.9)
Alkaline Phosphatase: 81 IU/L (ref 47–123)
BUN/Creatinine Ratio: 13 (ref 10–24)
BUN: 14 mg/dL (ref 8–27)
Bilirubin Total: 1 mg/dL (ref 0.0–1.2)
CO2: 21 mmol/L (ref 20–29)
Calcium: 9.2 mg/dL (ref 8.6–10.2)
Chloride: 105 mmol/L (ref 96–106)
Creatinine, Ser: 1.05 mg/dL (ref 0.76–1.27)
Globulin, Total: 2.1 g/dL (ref 1.5–4.5)
Glucose: 98 mg/dL (ref 70–99)
Potassium: 4.7 mmol/L (ref 3.5–5.2)
Sodium: 143 mmol/L (ref 134–144)
Total Protein: 6.5 g/dL (ref 6.0–8.5)
eGFR: 78 mL/min/1.73 (ref >59)

## 2024-08-22 LAB — LIPID PANEL
Chol/HDL Ratio: 2.9 ratio (ref 0.0–5.0)
Cholesterol, Total: 144 mg/dL (ref 100–199)
HDL: 50 mg/dL (ref >39)
LDL Chol Calc (NIH): 77 mg/dL (ref 0–99)
Triglycerides: 89 mg/dL (ref 0–149)
VLDL Cholesterol Cal: 17 mg/dL (ref 5–40)

## 2024-08-22 LAB — CBC WITH DIFFERENTIAL/PLATELET
Basophils Absolute: 0.1 x10E3/uL (ref 0.0–0.2)
Basos: 1 %
EOS (ABSOLUTE): 0.1 x10E3/uL (ref 0.0–0.4)
Eos: 2 %
Hematocrit: 52.7 % — ABNORMAL HIGH (ref 37.5–51.0)
Hemoglobin: 17.7 g/dL (ref 13.0–17.7)
Immature Grans (Abs): 0 x10E3/uL (ref 0.0–0.1)
Immature Granulocytes: 0 %
Lymphocytes Absolute: 2.9 x10E3/uL (ref 0.7–3.1)
Lymphs: 34 %
MCH: 29.9 pg (ref 26.6–33.0)
MCHC: 33.6 g/dL (ref 31.5–35.7)
MCV: 89 fL (ref 79–97)
Monocytes Absolute: 0.7 x10E3/uL (ref 0.1–0.9)
Monocytes: 8 %
Neutrophils Absolute: 4.8 x10E3/uL (ref 1.4–7.0)
Neutrophils: 54 %
Platelets: 234 x10E3/uL (ref 150–450)
RBC: 5.92 x10E6/uL — ABNORMAL HIGH (ref 4.14–5.80)
RDW: 14.2 % (ref 11.6–15.4)
WBC: 8.6 x10E3/uL (ref 3.4–10.8)

## 2024-08-22 LAB — TSH: TSH: 1.93 u[IU]/mL (ref 0.450–4.500)

## 2024-08-22 LAB — HEMOGLOBIN A1C
Est. average glucose Bld gHb Est-mCnc: 128 mg/dL
Hgb A1c MFr Bld: 6.1 % — ABNORMAL HIGH (ref 4.8–5.6)

## 2024-08-22 LAB — TESTOSTERONE: Testosterone: 481 ng/dL (ref 264–916)

## 2024-08-22 LAB — URIC ACID: Uric Acid: 5 mg/dL (ref 3.8–8.4)

## 2024-08-28 ENCOUNTER — Telehealth: Payer: Self-pay

## 2024-08-28 NOTE — Telephone Encounter (Signed)
 Copied from CRM #[id number]. Topic: Clinical - Lab/Test Results >> Aug 28, 2024  9:52 AM Antwanette L wrote: Reason for CRM: Patient is requesting a callback regarding lab resuls from 12/3. The patient can be reached at [phone]

## 2024-08-29 NOTE — Telephone Encounter (Signed)
 Called pt  and let him know his results.

## 2025-08-18 ENCOUNTER — Encounter: Admitting: Family Medicine
